# Patient Record
Sex: Male | Born: 1935 | Race: White | Hispanic: No | Marital: Married | Smoking: Former smoker
Health system: Southern US, Community
[De-identification: ages and names within clinical notes are randomized; demographics above are authoritative.]

## PROBLEM LIST (undated history)

## (undated) DIAGNOSIS — I10 Essential (primary) hypertension: Secondary | ICD-10-CM

## (undated) DIAGNOSIS — N400 Enlarged prostate without lower urinary tract symptoms: Secondary | ICD-10-CM

## (undated) DIAGNOSIS — N183 Chronic kidney disease, stage 3 unspecified: Secondary | ICD-10-CM

## (undated) DIAGNOSIS — M4800 Spinal stenosis, site unspecified: Secondary | ICD-10-CM

## (undated) DIAGNOSIS — M1711 Unilateral primary osteoarthritis, right knee: Secondary | ICD-10-CM

## (undated) DIAGNOSIS — Z87442 Personal history of urinary calculi: Secondary | ICD-10-CM

## (undated) DIAGNOSIS — C801 Malignant (primary) neoplasm, unspecified: Secondary | ICD-10-CM

## (undated) HISTORY — PX: HIP ARTHROPLASTY: SHX981

## (undated) HISTORY — PX: TONSILLECTOMY: SUR1361

## (undated) HISTORY — PX: OTHER SURGICAL HISTORY: SHX169

## (undated) HISTORY — PX: JOINT REPLACEMENT: SHX530

## (undated) HISTORY — PX: EYE SURGERY: SHX253

---

## 2009-01-13 ENCOUNTER — Inpatient Hospital Stay (HOSPITAL_COMMUNITY): Admission: RE | Admit: 2009-01-13 | Discharge: 2009-01-17 | Payer: Self-pay | Admitting: Orthopedic Surgery

## 2009-03-21 ENCOUNTER — Encounter: Admission: RE | Admit: 2009-03-21 | Discharge: 2009-03-21 | Payer: Self-pay | Admitting: Orthopedic Surgery

## 2009-09-08 ENCOUNTER — Inpatient Hospital Stay (HOSPITAL_COMMUNITY): Admission: RE | Admit: 2009-09-08 | Discharge: 2009-09-09 | Payer: Self-pay | Admitting: Neurosurgery

## 2009-10-09 ENCOUNTER — Encounter: Admission: RE | Admit: 2009-10-09 | Discharge: 2009-10-09 | Payer: Self-pay | Admitting: Neurosurgery

## 2011-03-19 LAB — TYPE AND SCREEN
ABO/RH(D): A POS
Antibody Screen: NEGATIVE

## 2011-03-19 LAB — DIFFERENTIAL
Eosinophils Relative: 5 % (ref 0–5)
Lymphocytes Relative: 23 % (ref 12–46)
Lymphs Abs: 1.2 10*3/uL (ref 0.7–4.0)
Monocytes Relative: 14 % — ABNORMAL HIGH (ref 3–12)
Neutrophils Relative %: 57 % (ref 43–77)

## 2011-03-19 LAB — BASIC METABOLIC PANEL
BUN: 14 mg/dL (ref 6–23)
CO2: 26 mEq/L (ref 19–32)
Chloride: 105 mEq/L (ref 96–112)
GFR calc Af Amer: >60 mL/min (ref >60)
Glucose, Bld: 97 mg/dL (ref 70–99)
Potassium: 4.1 mEq/L (ref 3.5–5.1)
Sodium: 136 mEq/L (ref 135–145)

## 2011-03-19 LAB — CBC
HCT: 40.4 % (ref 39.0–52.0)
Hemoglobin: 13.9 g/dL (ref 13.0–17.0)
RBC: 4.1 MIL/uL — ABNORMAL LOW (ref 4.22–5.81)
WBC: 5 10*3/uL (ref 4.0–10.5)

## 2011-03-29 LAB — URINALYSIS, ROUTINE W REFLEX MICROSCOPIC
Bilirubin Urine: NEGATIVE
Glucose, UA: NEGATIVE mg/dL
Nitrite: NEGATIVE
Specific Gravity, Urine: 1.017 (ref 1.005–1.030)
pH: 6 (ref 5.0–8.0)

## 2011-03-29 LAB — DIFFERENTIAL
Basophils Relative: 2 % — ABNORMAL HIGH (ref 0–1)
Lymphocytes Relative: 27 % (ref 12–46)
Lymphs Abs: 1.8 10*3/uL (ref 0.7–4.0)
Neutro Abs: 3.9 10*3/uL (ref 1.7–7.7)
Neutrophils Relative %: 58 % (ref 43–77)

## 2011-03-29 LAB — CBC
Hemoglobin: 14.3 g/dL (ref 13.0–17.0)
MCV: 99.1 fL (ref 78.0–100.0)
Platelets: 181 10*3/uL (ref 150–400)

## 2011-03-29 LAB — APTT: aPTT: 24 seconds (ref 24–37)

## 2011-03-29 LAB — BASIC METABOLIC PANEL
BUN: 17 mg/dL (ref 6–23)
Chloride: 103 mEq/L (ref 96–112)
Potassium: 4.3 mEq/L (ref 3.5–5.1)
Sodium: 138 mEq/L (ref 135–145)

## 2011-03-29 LAB — PROTIME-INR
INR: 1 (ref 0.00–1.49)
Prothrombin Time: 12.9 seconds (ref 11.6–15.2)

## 2011-03-29 LAB — TYPE AND SCREEN

## 2011-03-30 LAB — CROSSMATCH: ABO/RH(D): A POS

## 2011-03-30 LAB — BASIC METABOLIC PANEL
Calcium: 7.8 mg/dL — ABNORMAL LOW (ref 8.4–10.5)
Chloride: 102 mEq/L (ref 96–112)
GFR calc Af Amer: 56 mL/min — ABNORMAL LOW (ref >60)
GFR calc non Af Amer: 46 mL/min — ABNORMAL LOW (ref >60)
Sodium: 135 mEq/L (ref 135–145)

## 2011-03-30 LAB — CBC
HCT: 24.9 % — ABNORMAL LOW (ref 39.0–52.0)
Hemoglobin: 10 g/dL — ABNORMAL LOW (ref 13.0–17.0)
Hemoglobin: 8.8 g/dL — ABNORMAL LOW (ref 13.0–17.0)
Hemoglobin: 9.2 g/dL — ABNORMAL LOW (ref 13.0–17.0)
MCV: 100.2 fL — ABNORMAL HIGH (ref 78.0–100.0)
MCV: 97.7 fL (ref 78.0–100.0)
MCV: 98.6 fL (ref 78.0–100.0)
Platelets: 135 10*3/uL — ABNORMAL LOW (ref 150–400)
Platelets: 136 10*3/uL — ABNORMAL LOW (ref 150–400)
RBC: 2.77 MIL/uL — ABNORMAL LOW (ref 4.22–5.81)
RDW: 12.8 % (ref 11.5–15.5)
RDW: 13 % (ref 11.5–15.5)
WBC: 8.5 10*3/uL (ref 4.0–10.5)
WBC: 9.4 10*3/uL (ref 4.0–10.5)

## 2011-03-30 LAB — PROTIME-INR
INR: 1.2 (ref 0.00–1.49)
INR: 2.3 — ABNORMAL HIGH (ref 0.00–1.49)
Prothrombin Time: 15.6 seconds — ABNORMAL HIGH (ref 11.6–15.2)
Prothrombin Time: 22.3 seconds — ABNORMAL HIGH (ref 11.6–15.2)

## 2011-04-27 NOTE — Op Note (Signed)
NAME:  Eric [name], Eric [name] NO.:  [id number]   MEDICAL RECORD NO.:  [id number]          PATIENT TYPE:  INP   LOCATION:  5032                         FACILITY:  [facility]   PHYSICIAN:  Feliberto Gottron. Turner Daniels, M.D.   DATE OF BIRTH:  1929/09/24   DATE OF PROCEDURE:  01/13/2009  DATE OF DISCHARGE:                               OPERATIVE REPORT   PREOPERATIVE DIAGNOSIS:  End-stage arthritis left hip.   POSTOPERATIVE DIAGNOSIS:  End-stage arthritis left hip.   PROCEDURE:  Left total hip arthroplasty using a DePuy 56-mm ASR cup, S-  ROM 18 x 13 x 42 x 150 stem, 18 F large cone NK +0 49-mm ultimate head.   SURGEON:  Feliberto Gottron.  Turner Daniels, MD   FIRST ASSISTANT:  Shirl Harris, PA-C   ANESTHETIC:  General endotracheal.   ESTIMATED BLOOD LOSS:  400 mL   FLUID REPLACEMENT:  1800 mL of crystalloid.   DRAINS PLACED:  Foley catheter.   URINE OUTPUT:  300 mL.   INDICATIONS FOR PROCEDURE:  A 77 year old male with end-stage arthritis  of his left hip who has failed conservative treatment, has bone-on-bone  arthritic changes on x-ray and the pain is affecting his ability to do  household chores, to ambulate and is waking him up at night.  He desires  elective left total hip arthroplasty.  Risks and benefits of surgery  have been discussed and questions answered.   DESCRIPTION OF PROCEDURE:  The patient identified by armband and  received preoperative antibiotics IV in the holding area at [facility].  He was then taken to the operating room 1 where the  appropriate anesthetic monitors were attached and general endotracheal  anesthesia induced with the patient in supine position.  A Foley  catheter was inserted.  He was rolled into the right lateral decubitus  position and fixed there with a Stulberg Mark II pelvic clamp.  The left  lower extremity was then prepped and draped in usual sterile fashion  from the ankle to the hemipelvis.  Standard time-out procedure was  performed.  The  skin along the lateral hip and thigh was infiltrated  with 20 mL of 0.5%Marcaine and epinephrine solution and a 20-cm incision  centered over the greater trochanter was made through the skin and  subcutaneous tissue.  Small bleeders were identified and cauterized with  electrocautery.  We cut down to the IT band and made an incision in the  IT band paralleling the skin incision.  This took [place] down to the greater  trochanter and a Cobra retractor was placed between the gluteus minimus  and the superior hip joint capsule and between the quadratus femoris and  the inferior hip joint capsule.  This isolated the piriformis and short  external rotators which were then tagged with a #2 Ethibond suture and  cut off their insertion on the intertrochanteric crest exposing the hip  joint capsule itself.  This was developed into an acetabular based flap  going from posterior-superior and the acetabulum out of the femoral neck  and then out inferior posterior on the acetabulum.  This flap was tagged  with two #2 Ethibond sutures exposing the arthritic femoral head.  The  hip was flexed, internally rotated dislocating the femoral head and a  standard neck cut was performed one fingerbreadth above the lesser  trochanter.  The proximal femur was then translated anteriorly using a  Hohmann retractor levering over the anterior column.  A posterior  inferior wing retractor was placed at the junction of the ischium and  the acetabulum and a Cobra spiked retractor was placed in the cotyloid  notch.  We then excised the labrum and sequentially reamed up to a 55-mm  basket reamer obtaining good fit and fill in all quadrants, touched the  edge only with a 56 reamer, irrigated the acetabulum out with normal  saline solution and inserted a 56-mm ASR cup in 45 degrees of abduction  and about 20 degrees of anteversion following the normal rim of the  acetabulum.  Peripheral osteophytes were then removed.  The cuff  had  good fit and fill and you can almost looked as pelvis off the table with  the inserter.  This was then flexed and internally rotated exposing the  proximal femur which was then entered with a box cutting chisel followed  by the initiating reamer and then axial reaming up to a 12-mm axial  reamer where we first started getting chatter.  We then went by halves  to 13.5 in an excellent cortical chatter reamed to the proximal 4 inches  of the femur with the 14-mm reamer and then conically reamed up to an 18  F cone and milled the calcar to an 18 F large calcar piece.  We then  inserted an 86 F large trial cone, followed by a trial stem with a 42  base neck and a NK +0 49-mm trial head in the same version as the calcar  about 15 degrees.  The hip was reduced and then taken through a range of  motion to 90 of flexion with 60 or 70 of internal rotation before any  instability and it could not be dislocated in extension and external  rotation.  At this point, the trial components were removed and an 54 F  large ZTT1 cone was hammered into place followed by an 18 x 13 x 42 x  150 stem in the same version as the cone.  Once the stem was seated, an  NK +0 49-mm ultimate head was placed on the stem and the hip reduced and  taken through a range of motion and stability was excellent.  Small  bleeders were once again identified and cauterized.  The wound irrigated  out with normal saline solution.  The capsular flap and short external  rotators were repaired back to the intertrochanteric crest through drill  holes using #2 Ethibond suture and a small crack along the tip of the  greater trochanter was repaired back to the greater trochanter through  drill holes using a #2 FiberWire figure-of-eight suture.  This was only  the posterior quarter inch of the abductor insertion.  80-90% of the  insertion was completely intact.  At this point, we irrigated out one  more time with normal saline solution.   The IT band was then closed with  running #1 Vicryl suture, the subcutaneous tissue with 0 in 2 layers of  2-0 Vicryl suture and the skin with running interlocking 3-0 nylon  suture.  A dressing of Xeroform and Mepilex was then applied.  The  patient was unclamped, rolled supine, awakened  and taken to the recovery  room without difficulty.      Feliberto Gottron. Turner Daniels, M.D.  Electronically Signed     FJR/MEDQ  D:  01/13/2009  T:  01/14/2009  Job:  [id number]

## 2011-04-30 NOTE — Discharge Summary (Signed)
NAME:  Eric [name], Eric [name] NO.:  [id number]   MEDICAL RECORD NO.:  [id number]          PATIENT TYPE:  INP   LOCATION:  5032                         FACILITY:  [facility]   PHYSICIAN:  Feliberto Gottron. Turner Daniels, M.D.   DATE OF BIRTH:  1929-12-12   DATE OF ADMISSION:  01/13/2009  DATE OF DISCHARGE:  01/17/2009                               DISCHARGE SUMMARY   CHIEF COMPLAINT:  Left hip pain.   HISTORY OF PRESENT ILLNESS:  This is a 77 year old gentleman who  complains of severe unremitting pain in his left hip, tried conservative  treatment with antiinflammatories and narcotic pain medicines.  He  desires a surgical intervention at this time.  All risks and benefits of  surgery were discussed with the patient.   PAST MEDICAL HISTORY:  Significant for hypertension.   PAST SURGICAL HISTORY:  He has had a carpal tunnel release and elbow  surgery.   SOCIAL HISTORY:  He denies the use of tobacco, alcohol.   He has no known drug allergies.   CURRENT MEDICATIONS:  Lisinopril 20 mg 1 p.o. daily.   FAMILY HISTORY:  Noncontributory.   PHYSICAL EXAMINATION:  Gross examination of the left hip demonstrates  the patient to have tenderness with internal rotation which is minimal.  He has approximately 20 degrees external rotation.  He is  neurovascularly intact.  X-rays demonstrate bone-on-bone degenerative  joint disease of the left hip.   PREOPERATIVE LABORATORY DATA:  White blood cells 6.7, red blood cells  4.3, hemoglobin 14.3, hematocrit 42.6, platelets 181.  PT 12.9, INR of  1.0, PTT 24.  Sodium 138, potassium 4.3, chloride 103, glucose 113, BUN  17, creatinine 1.14.  Urinalysis was within normal limits.   HOSPITAL COURSE:  Mr. Abreu was admitted to [facility] on January 13, 2009, when he underwent a left total hip arthroplasty, performed by  Dr. Gean Birchwood.  A perioperative Foley catheter was placed.  The  patient tolerated the procedure well and was transferred to the  orthopedic floor.  He was placed on Lovenox and Coumadin for DVT  prophylaxis.  On the first postoperative day, the patient was awake and  alert.  Hemoglobin was 10.0.  The urine output was minimal, so he was  given a bolus of normal saline.  He was evaluated by physical therapy.  On the second postoperative day, the patient was awake and alert and  voiding well.  He had ambulated 20 feet with physical therapy on the  previous day.  Hemoglobin was 9.2. His Foley catheter was discontinued.  On the third postoperative day, the patient complained of fatigue and  dizziness with physical therapy.  His hemoglobin was 8.8, and he was  transfused with a 1 unit of packed red blood cells.  His physical  therapy was somewhat limited by pain in his knee secondary to known  arthritis, so he was given a compressive knee sleeve to allow him to do  therapy.  On the fourth postoperative day, the patient's hemoglobin had  increased to 9.1.  His surgical dressing was clean and dry, and his  incision remained benign.  He was able to pass all of his physical  therapies on this day and was discharged.  The home health care managed  his wound and  Coumadin.   DISPOSITION:  The patient was discharged home on January 17, 2009.  He  was weightbearing as tolerated.  Home health care will manage his wound,  Coumadin, and physical therapy and he would return to the clinic in 1  week to see Dr. Turner Daniels.   Discharge medicines were as per the HMR with the addition of Percocet 5  mg 1-2 tablets p.o. q.4 h. p.r.n. pain and Coumadin dosed by the  pharmacy to take as directed with a target INR of 1.5-2.   FINAL DIAGNOSIS:  End-stage degenerative joint disease of the left hip  with a secondary diagnosis of acute blood loss anemia.      Shirl Harris, PA      Feliberto Gottron. Turner Daniels, M.D.  Electronically Signed    JW/MEDQ  D:  02/17/2009  T:  02/17/2009  Job:  [id number]

## 2011-06-03 ENCOUNTER — Other Ambulatory Visit: Payer: Self-pay | Admitting: Orthopedic Surgery

## 2011-06-03 DIAGNOSIS — M25552 Pain in left hip: Secondary | ICD-10-CM

## 2011-06-05 ENCOUNTER — Ambulatory Visit
Admission: RE | Admit: 2011-06-05 | Discharge: 2011-06-05 | Disposition: A | Discharge status: Home / Self Care | Payer: Medicare Other | Source: Ambulatory Visit | Attending: Orthopedic Surgery | Admitting: Orthopedic Surgery

## 2011-06-05 DIAGNOSIS — M25552 Pain in left hip: Secondary | ICD-10-CM

## 2014-02-06 ENCOUNTER — Other Ambulatory Visit (HOSPITAL_COMMUNITY): Payer: Self-pay | Admitting: Orthopedic Surgery

## 2014-02-06 DIAGNOSIS — M25552 Pain in left hip: Secondary | ICD-10-CM

## 2014-02-21 ENCOUNTER — Ambulatory Visit (HOSPITAL_COMMUNITY)
Admission: RE | Admit: 2014-02-21 | Discharge: 2014-02-21 | Disposition: A | Discharge status: Home / Self Care | Payer: Medicare Other | Source: Ambulatory Visit | Attending: Orthopedic Surgery | Admitting: Orthopedic Surgery

## 2014-02-21 DIAGNOSIS — M25552 Pain in left hip: Secondary | ICD-10-CM

## 2014-02-21 DIAGNOSIS — M25559 Pain in unspecified hip: Secondary | ICD-10-CM | POA: Insufficient documentation

## 2015-11-19 DIAGNOSIS — N400 Enlarged prostate without lower urinary tract symptoms: Secondary | ICD-10-CM | POA: Insufficient documentation

## 2015-11-19 DIAGNOSIS — M4800 Spinal stenosis, site unspecified: Secondary | ICD-10-CM | POA: Insufficient documentation

## 2015-11-19 DIAGNOSIS — H35033 Hypertensive retinopathy, bilateral: Secondary | ICD-10-CM | POA: Insufficient documentation

## 2016-09-15 ENCOUNTER — Other Ambulatory Visit: Payer: Self-pay | Admitting: Orthopedic Surgery

## 2016-09-22 ENCOUNTER — Encounter (HOSPITAL_BASED_OUTPATIENT_CLINIC_OR_DEPARTMENT_OTHER): Payer: Self-pay | Admitting: *Deleted

## 2016-09-23 ENCOUNTER — Encounter (HOSPITAL_BASED_OUTPATIENT_CLINIC_OR_DEPARTMENT_OTHER): Payer: Self-pay | Admitting: *Deleted

## 2016-09-23 NOTE — Progress Notes (Signed)
This patient has screened at an elevated risk for Obstructive Sleep Apnea using the STOP-Bang tool during a presurgical visit. A score of 4 or greater is an elevated risk. 

## 2016-09-24 ENCOUNTER — Encounter (HOSPITAL_BASED_OUTPATIENT_CLINIC_OR_DEPARTMENT_OTHER)
Admission: RE | Admit: 2016-09-24 | Discharge: 2016-09-24 | Disposition: A | Discharge status: Home / Self Care | Payer: Medicare Other | Source: Ambulatory Visit | Attending: Orthopedic Surgery | Admitting: Orthopedic Surgery

## 2016-09-24 DIAGNOSIS — G5601 Carpal tunnel syndrome, right upper limb: Secondary | ICD-10-CM | POA: Diagnosis not present

## 2016-09-24 DIAGNOSIS — Z01818 Encounter for other preprocedural examination: Secondary | ICD-10-CM | POA: Insufficient documentation

## 2016-09-28 NOTE — H&P (Signed)
Eric [name] is an 82 y.o. male.    Chief Complaint: Right Hand pain and numbness  HPI: Eric [name] is an 82 year old male that is presenting with right hand pain and numbness.  This pain is been ongoing for the past 1-2 years.  He wears a night splint at times with minimal improvement.  He has had left wrist carpal tunnel release.  He doesn't feel his symptoms are as severe as his left hand was.  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  He is not able to pick some things up.  He denies dropping things that he has been caring.  He has not taken any medications.  Past Medical History:  Diagnosis Date  . BPH (benign prostatic hyperplasia)   . Chronic kidney disease    pt states no problems with kidneys  . Hypertension   . Spinal stenosis     Past Surgical History:  Procedure Laterality Date  . cataract surgery Bilateral ~15 yrs ago  . JOINT REPLACEMENT     hip  . TONSILLECTOMY      History reviewed. No pertinent family history. Social History:  reports that he has quit smoking. His smoking use included Cigarettes. He has never used smokeless tobacco. He reports that he does not drink alcohol or use drugs.  Allergies:  Allergies  Allergen Reactions  . Nsaids     Renal insufficiency     No prescriptions prior to admission.    No results found for this or any previous visit (from the past 48 hour(s)). No results found.  Review of Systems  Constitutional: Negative.   HENT: Positive for tinnitus.   Eyes: Negative.   Respiratory: Negative.   Cardiovascular: Negative.   Gastrointestinal: Negative.   Genitourinary: Positive for frequency.       Enlarged prostate  Musculoskeletal: Negative.   Skin: Negative.   Neurological: Negative.   Endo/Heme/Allergies: Negative.   Psychiatric/Behavioral: Negative.     Height 5\' 9"  (1.753 m), weight 113.4 kg (250 lb). Physical Exam  Constitutional: He is oriented to person, place, and time. He appears well-developed and  well-nourished.  HENT:  Head: Normocephalic and atraumatic.  Eyes: Pupils are equal, round, and reactive to light.  Neck: Normal range of motion. Neck supple.  Cardiovascular: Intact distal pulses.   Respiratory: Effort normal.  Musculoskeletal:  Skin is appropriately warm to touch.  No swelling.  No signs of compromise.  Right wrist: No obvious atrophy.  Normal thumb opposition.  Normal pincer grasp.  Negative grind test.  Positive Tinel's test.  Normal range of motion of the wrist.  Neurological: He is alert and oriented to person, place, and time.  Skin: Skin is warm and dry.  Psychiatric: He has a normal mood and affect. His behavior is normal. Judgment and thought content normal.     Assessment/Plan 1.  Right hand pain and numbness related to carpal tunnel syndrome.  Plan: We will plan to proceed with carpal tunnel release surgery of the right wrist.  Zaara Sprowl R, PA-C 09/28/2016, 8:06 AM

## 2016-09-29 ENCOUNTER — Encounter (HOSPITAL_BASED_OUTPATIENT_CLINIC_OR_DEPARTMENT_OTHER): Payer: Self-pay | Admitting: Certified Registered"

## 2016-09-29 ENCOUNTER — Encounter (HOSPITAL_BASED_OUTPATIENT_CLINIC_OR_DEPARTMENT_OTHER)
Admission: RE | Disposition: A | Discharge status: Home / Self Care | Payer: Self-pay | Source: Ambulatory Visit | Attending: Orthopedic Surgery

## 2016-09-29 ENCOUNTER — Ambulatory Visit (HOSPITAL_BASED_OUTPATIENT_CLINIC_OR_DEPARTMENT_OTHER): Payer: Medicare Other | Admitting: Certified Registered"

## 2016-09-29 ENCOUNTER — Ambulatory Visit (HOSPITAL_BASED_OUTPATIENT_CLINIC_OR_DEPARTMENT_OTHER)
Admission: RE | Admit: 2016-09-29 | Discharge: 2016-09-29 | Disposition: A | Discharge status: Home / Self Care | Payer: Medicare Other | Source: Ambulatory Visit | Attending: Orthopedic Surgery | Admitting: Orthopedic Surgery

## 2016-09-29 DIAGNOSIS — Z87891 Personal history of nicotine dependence: Secondary | ICD-10-CM | POA: Insufficient documentation

## 2016-09-29 DIAGNOSIS — I10 Essential (primary) hypertension: Secondary | ICD-10-CM | POA: Insufficient documentation

## 2016-09-29 DIAGNOSIS — G5601 Carpal tunnel syndrome, right upper limb: Secondary | ICD-10-CM | POA: Insufficient documentation

## 2016-09-29 DIAGNOSIS — N400 Enlarged prostate without lower urinary tract symptoms: Secondary | ICD-10-CM | POA: Diagnosis not present

## 2016-09-29 DIAGNOSIS — Z79899 Other long term (current) drug therapy: Secondary | ICD-10-CM | POA: Insufficient documentation

## 2016-09-29 HISTORY — DX: Spinal stenosis, site unspecified: M48.00

## 2016-09-29 HISTORY — DX: Essential (primary) hypertension: I10

## 2016-09-29 HISTORY — PX: CARPAL TUNNEL RELEASE: SHX101

## 2016-09-29 HISTORY — DX: Benign prostatic hyperplasia without lower urinary tract symptoms: N40.0

## 2016-09-29 SURGERY — CARPAL TUNNEL RELEASE
Anesthesia: Regional | Site: Wrist | Laterality: Right

## 2016-09-29 MED ORDER — FENTANYL CITRATE (PF) 100 MCG/2ML IJ SOLN
50.0000 ug | INTRAMUSCULAR | Status: DC | PRN
  Administered 2016-09-29 (×2): 25 ug via INTRAVENOUS

## 2016-09-29 MED ORDER — LACTATED RINGERS IV SOLN
INTRAVENOUS | Status: DC
  Administered 2016-09-29: 10:00:00 via INTRAVENOUS

## 2016-09-29 MED ORDER — GLYCOPYRROLATE 0.2 MG/ML IJ SOLN: 0.2000 mg | Freq: Once | INTRAMUSCULAR | Status: DC | PRN

## 2016-09-29 MED ORDER — BUPIVACAINE HCL (PF) 0.5 % IJ SOLN
INTRAMUSCULAR | Status: DC | PRN
  Administered 2016-09-29: 7.5 mL

## 2016-09-29 MED ORDER — FENTANYL CITRATE (PF) 100 MCG/2ML IJ SOLN
INTRAMUSCULAR | Status: AC
  Filled 2016-09-29: qty 2

## 2016-09-29 MED ORDER — BUPIVACAINE HCL (PF) 0.5 % IJ SOLN
INTRAMUSCULAR | Status: AC
  Filled 2016-09-29: qty 30

## 2016-09-29 MED ORDER — MIDAZOLAM HCL 2 MG/2ML IJ SOLN: 1.0000 mg | INTRAMUSCULAR | Status: DC | PRN

## 2016-09-29 MED ORDER — LIDOCAINE 2% (20 MG/ML) 5 ML SYRINGE
INTRAMUSCULAR | Status: AC
  Filled 2016-09-29: qty 5

## 2016-09-29 MED ORDER — LIDOCAINE HCL (PF) 1 % IJ SOLN
INTRAMUSCULAR | Status: AC
  Filled 2016-09-29: qty 30

## 2016-09-29 MED ORDER — DEXTROSE-NACL 5-0.45 % IV SOLN: INTRAVENOUS | Status: DC

## 2016-09-29 MED ORDER — LIDOCAINE HCL (PF) 0.5 % IJ SOLN
INTRAMUSCULAR | Status: DC | PRN
  Administered 2016-09-29: 30 mL via INTRAVENOUS

## 2016-09-29 MED ORDER — CEFAZOLIN SODIUM-DEXTROSE 2-4 GM/100ML-% IV SOLN
2.0000 g | INTRAVENOUS | Status: AC
  Administered 2016-09-29: 2 g via INTRAVENOUS

## 2016-09-29 MED ORDER — CHLORHEXIDINE GLUCONATE 4 % EX LIQD: 60.0000 mL | Freq: Once | CUTANEOUS | Status: DC

## 2016-09-29 MED ORDER — HYDROCODONE-ACETAMINOPHEN 5-325 MG PO TABS
1.0000 | ORAL_TABLET | Freq: Four times a day (QID) | ORAL | 0 refills | Status: DC | PRN
Start: 2016-09-29 — End: 2018-04-04

## 2016-09-29 MED ORDER — CEFAZOLIN SODIUM-DEXTROSE 2-4 GM/100ML-% IV SOLN
INTRAVENOUS | Status: AC
  Filled 2016-09-29: qty 100

## 2016-09-29 MED ORDER — PROPOFOL 10 MG/ML IV BOLUS
INTRAVENOUS | Status: DC | PRN
  Administered 2016-09-29: 20 mg via INTRAVENOUS

## 2016-09-29 SURGICAL SUPPLY — 41 items
BLADE SURG 15 STRL LF DISP TIS (BLADE) ×1 IMPLANT
BLADE SURG 15 STRL SS (BLADE) ×2
BNDG ELASTIC 2X5.8 VLCR STR LF (GAUZE/BANDAGES/DRESSINGS) ×3 IMPLANT
BNDG ESMARK 4X9 LF (GAUZE/BANDAGES/DRESSINGS) ×3 IMPLANT
CORDS BIPOLAR (ELECTRODE) ×3 IMPLANT
COVER BACK TABLE 60X90IN (DRAPES) ×3 IMPLANT
COVER MAYO STAND STRL (DRAPES) ×3 IMPLANT
CUFF TOURNIQUET SINGLE 18IN (TOURNIQUET CUFF) ×3 IMPLANT
DRAPE EXTREMITY T 121X128X90 (DRAPE) ×3 IMPLANT
DRAPE SURG 17X23 STRL (DRAPES) ×3 IMPLANT
DRAPE U-SHAPE 47X51 STRL (DRAPES) IMPLANT
DURAPREP 26ML APPLICATOR (WOUND CARE) ×3 IMPLANT
GAUZE XEROFORM 1X8 LF (GAUZE/BANDAGES/DRESSINGS) ×3 IMPLANT
GLOVE BIO SURGEON STRL SZ7.5 (GLOVE) ×3 IMPLANT
GLOVE BIO SURGEON STRL SZ8.5 (GLOVE) ×3 IMPLANT
GLOVE BIOGEL PI IND STRL 7.0 (GLOVE) ×1 IMPLANT
GLOVE BIOGEL PI IND STRL 8 (GLOVE) ×2 IMPLANT
GLOVE BIOGEL PI IND STRL 9 (GLOVE) ×1 IMPLANT
GLOVE BIOGEL PI INDICATOR 7.0 (GLOVE) ×2
GLOVE BIOGEL PI INDICATOR 8 (GLOVE) ×4
GLOVE BIOGEL PI INDICATOR 9 (GLOVE) ×2
GLOVE SURG SS PI 7.5 STRL IVOR (GLOVE) ×3 IMPLANT
GOWN STRL REUS W/ TWL LRG LVL3 (GOWN DISPOSABLE) ×1 IMPLANT
GOWN STRL REUS W/ TWL XL LVL3 (GOWN DISPOSABLE) ×1 IMPLANT
GOWN STRL REUS W/TWL LRG LVL3 (GOWN DISPOSABLE) ×2
GOWN STRL REUS W/TWL XL LVL3 (GOWN DISPOSABLE) ×5 IMPLANT
NEEDLE HYPO 22GX1.5 SAFETY (NEEDLE) ×3 IMPLANT
NS IRRIG 1000ML POUR BTL (IV SOLUTION) ×3 IMPLANT
PACK BASIN DAY SURGERY FS (CUSTOM PROCEDURE TRAY) ×3 IMPLANT
PADDING CAST ABS 4INX4YD NS (CAST SUPPLIES) ×2
PADDING CAST ABS COTTON 4X4 ST (CAST SUPPLIES) ×1 IMPLANT
PADDING UNDERCAST 2 STRL (CAST SUPPLIES) ×2
PADDING UNDERCAST 2X4 STRL (CAST SUPPLIES) ×1 IMPLANT
SLEEVE SCD COMPRESS KNEE MED (MISCELLANEOUS) IMPLANT
SPONGE GAUZE 4X4 12PLY STER LF (GAUZE/BANDAGES/DRESSINGS) ×6 IMPLANT
SUT ETHILON 4 0 PS 2 18 (SUTURE) ×3 IMPLANT
SUT MON AB 4-0 PC3 18 (SUTURE) IMPLANT
SYR BULB 3OZ (MISCELLANEOUS) ×3 IMPLANT
SYR CONTROL 10ML LL (SYRINGE) ×3 IMPLANT
TOWEL OR 17X24 6PK STRL BLUE (TOWEL DISPOSABLE) ×3 IMPLANT
UNDERPAD 30X30 (UNDERPADS AND DIAPERS) ×3 IMPLANT

## 2016-09-29 NOTE — Transfer of Care (Signed)
Immediate Anesthesia Transfer of Care Note  Patient: Eric [name]  Procedure(s) Performed: Procedure(s): CARPAL TUNNEL RELEASE (Right)  Patient Location: PACU  Anesthesia Type:Bier block  Level of Consciousness: awake, alert , oriented and patient cooperative  Airway & Oxygen Therapy: Patient Spontanous Breathing  Post-op Assessment: Report given to RN and Post -op Vital signs reviewed and stable  Post vital signs: Reviewed and stable  Last Vitals:  Vitals:   09/29/16 1009 09/29/16 1201  BP: (!) 170/80   Pulse: 84 90  Resp: 18 (!) 21  Temp: 36.7 C     Last Pain:  Vitals:   09/29/16 1009  TempSrc: Oral  PainSc: 5       Patients Stated Pain Goal: 2 ([id number])  Complications: No apparent anesthesia complications

## 2016-09-29 NOTE — Anesthesia Postprocedure Evaluation (Signed)
Anesthesia Post Note  Patient: Eric [name]  Procedure(s) Performed: Procedure(s) (LRB): CARPAL TUNNEL RELEASE (Right)  Patient location during evaluation: PACU Anesthesia Type: Bier Block and MAC Level of consciousness: awake and alert Pain management: pain level controlled Vital Signs Assessment: post-procedure vital signs reviewed and stable Respiratory status: spontaneous breathing, nonlabored ventilation, respiratory function stable and patient connected to nasal cannula oxygen Cardiovascular status: blood pressure returned to baseline and stable Postop Assessment: no signs of nausea or vomiting Anesthetic complications: no    Last Vitals:  Vitals:   09/29/16 1220 09/29/16 1237  BP:  140/76  Pulse: 92 92  Resp: 18 18  Temp:  36.9 C    Last Pain:  Vitals:   09/29/16 1237  TempSrc: Oral  PainSc: 0-No pain                 Effie Berkshire

## 2016-09-29 NOTE — Interval H&P Note (Signed)
History and Physical Interval Note:  09/29/2016 11:17 AM  Eric [name]  has presented today for surgery, with the diagnosis of RIGHT CARPAL TUNNEL SYNDROME  The various methods of treatment have been discussed with the patient and family. After consideration of risks, benefits and other options for treatment, the patient has consented to  Procedure(s): CARPAL TUNNEL RELEASE (Right) as a surgical intervention .  The patient's history has been reviewed, patient examined, no change in status, stable for surgery.  I have reviewed the patient's chart and labs.  Questions were answered to the patient's satisfaction.     Kerin Salen

## 2016-09-29 NOTE — Anesthesia Procedure Notes (Signed)
Procedure Name: MAC Date/Time: 09/29/2016 11:32 AM Performed by: [name] Flattery Pre-anesthesia Checklist: Patient identified, Emergency Drugs available, Suction available and Patient being monitored Patient Re-evaluated:Patient Re-evaluated prior to inductionOxygen Delivery Method: Simple face mask Preoxygenation: Pre-oxygenation with 100% oxygen Intubation Type: IV induction

## 2016-09-29 NOTE — Discharge Instructions (Signed)

## 2016-09-29 NOTE — Op Note (Signed)
PATIENT ID:      Eric [name]  MRN:     [id number] DOB/AGE:    04/28/1929 / 82 y.o.       OPERATIVE REPORT    DATE OF PROCEDURE:  09/29/2016       PREOPERATIVE DIAGNOSIS:   RIGHT CARPAL TUNNEL SYNDROME      Estimated body mass index is 37.07 kg/m as calculated from the following:   Height as of this encounter: 5\' 9"  (1.753 m).   Weight as of this encounter: 113.9 kg (251 lb).                                                        POSTOPERATIVE DIAGNOSIS:   RIGHT CARPAL TUNNEL SYNDROME                                                                      PROCEDURE:  Procedure(s): CARPAL TUNNEL RELEASE     SURGEON: [id number]    ASSISTANT:   [name] PA-C   (Present and scrubbed throughout the case, critical for assistance with exposure, retraction, and closure.)         ANESTHESIA: GET    TOURNIQUET TIME: [id number]   COMPLICATIONS:  None        SPECIMENS: None   INDICATIONS FOR PROCEDURE: The patient has R CTS . Pain wakes patient up at least 5 of 7 nights per week with numbness to the fingertips. Patient is failed conservative measures with observation anti-inflammatory medicine and night splints. Risks and benefits of surgery have been discussed, questions answered.    A tourniquet was placed on the forearm. The upper extremity was prepped and draped in the usual sterile fashion from the tourniquet to the fingertips. A timeout procedure was performed.The hand and forearm were wrapped with an Esmarch bandage and the tourniquet inflated to 246mm Hg. A longitudinal palmar incision starting at the wrist flexion crease going distally for 4 cm just to the ulnar-sided the thenar crease was made through the skin and subcutaneous tissue. With traction on the skin edges, we then cut down into the carpal tunnel distally, passed a Freer elevator into the carpal tunnel palmarly and cut down on the elevator performing the carpal tunnel release. The fascial incision was extended into the  forearm with the black handled scissors for 2-3cm.We then examined the tendons and the median nerve which had an hourglass appearance but was intact. There were no ganglia or masses in the carpal tunnel. The wound was irrigated out normal saline solution and the skin only closed with running 4-0 nylon suture. A dressing of Xerofoam, 4 x 4 dressing sponges, 2 inch web roll and 2 inch Ace wrap was applied and the Esmarch tourniquet removed for a tourniquet time of 10 minutes. At this point, a sling was applied, the patient was laid supine awakened extubated and taken to the recovery room.

## 2016-09-29 NOTE — Anesthesia Preprocedure Evaluation (Addendum)
Anesthesia Evaluation  Patient identified by MRN, date of birth, ID band Patient awake    Reviewed: Allergy & Precautions, NPO status , Patient's Chart, lab work & pertinent test results  Airway Mallampati: II  TM Distance: >3 FB Neck ROM: Full    Dental  (+) Edentulous Upper, Edentulous Lower   Pulmonary former smoker,    breath sounds clear to auscultation       Cardiovascular hypertension, Pt. on medications  Rhythm:Regular Rate:Normal     Neuro/Psych negative neurological ROS  negative psych ROS   GI/Hepatic negative GI ROS, Neg liver ROS,   Endo/Other  negative endocrine ROS  Renal/GU   negative genitourinary   Musculoskeletal negative musculoskeletal ROS (+)   Abdominal   Peds negative pediatric ROS (+)  Hematology negative hematology ROS (+)   Anesthesia Other Findings   Reproductive/Obstetrics negative OB ROS                            Lab Results  Component Value Date   WBC 5.0 09/02/2009   HGB 13.9 09/02/2009   HCT 40.4 09/02/2009   MCV 98.6 09/02/2009   PLT 162 09/02/2009   Lab Results  Component Value Date   CREATININE 1.24 09/02/2009   BUN 14 09/02/2009   NA 136 09/02/2009   K 4.1 09/02/2009   CL 105 09/02/2009   CO2 26 09/02/2009   Lab Results  Component Value Date   INR 2.3 (H) 01/17/2009   INR 2.3 (H) 01/16/2009   INR 1.8 (H) 01/15/2009   09/2016 EKG: normal sinus rhythm.  Anesthesia Physical Anesthesia Plan  ASA: II  Anesthesia Plan: Bier Block   Post-op Pain Management:    Induction: Intravenous  Airway Management Planned: Natural Airway  Additional Equipment:   Intra-op Plan:   Post-operative Plan:   Informed Consent: I have reviewed the patients History and Physical, chart, labs and discussed the procedure including the risks, benefits and alternatives for the proposed anesthesia with the patient or authorized representative who has  indicated his/her understanding and acceptance.     Plan Discussed with: CRNA  Anesthesia Plan Comments:         Anesthesia Quick Evaluation

## 2016-09-29 NOTE — Anesthesia Preprocedure Evaluation (Signed)
Anesthesia Evaluation    Airway        Dental   Pulmonary former smoker,           Cardiovascular hypertension, Pt. on medications      Neuro/Psych    GI/Hepatic   Endo/Other    Renal/GU Renal InsufficiencyRenal disease     Musculoskeletal   Abdominal   Peds  Hematology   Anesthesia Other Findings   Reproductive/Obstetrics                             Anesthesia Physical Anesthesia Plan Anesthesia Quick Evaluation

## 2016-09-30 ENCOUNTER — Encounter (HOSPITAL_BASED_OUTPATIENT_CLINIC_OR_DEPARTMENT_OTHER): Payer: Self-pay | Admitting: Orthopedic Surgery

## 2017-01-18 DIAGNOSIS — D3132 Benign neoplasm of left choroid: Secondary | ICD-10-CM | POA: Insufficient documentation

## 2018-03-28 ENCOUNTER — Other Ambulatory Visit: Payer: Self-pay | Admitting: Orthopedic Surgery

## 2018-03-31 DIAGNOSIS — N179 Acute kidney failure, unspecified: Secondary | ICD-10-CM | POA: Insufficient documentation

## 2018-03-31 DIAGNOSIS — N183 Chronic kidney disease, stage 3 unspecified: Secondary | ICD-10-CM | POA: Insufficient documentation

## 2018-03-31 DIAGNOSIS — N1831 Chronic kidney disease, stage 3a: Secondary | ICD-10-CM | POA: Insufficient documentation

## 2018-03-31 DIAGNOSIS — N1832 Chronic kidney disease, stage 3b: Secondary | ICD-10-CM | POA: Insufficient documentation

## 2018-03-31 DIAGNOSIS — I129 Hypertensive chronic kidney disease with stage 1 through stage 4 chronic kidney disease, or unspecified chronic kidney disease: Secondary | ICD-10-CM | POA: Insufficient documentation

## 2018-04-06 NOTE — Pre-Procedure Instructions (Signed)
Eric [name]  04/06/2018    Your procedure is scheduled on Wednesday, Apr 19, 2018 at 12:45 PM.   Report to [facility] Entrance "A" Admitting Office at 10:45 AM.   Call this number if you have problems the morning of surgery: [phone]   Questions prior to day of surgery, please call [phone] between 8 & 4 PM.   Remember:  Do not eat food or drink liquids after midnight Tuesday, 04/18/18.   Take these medicines the morning of surgery with A SIP OF WATER: Tylenol - if needed  Do not use Aspirin products or NSAIDS (Ibuprofen, Aleve, etc) 7 days prior to surgery.   Do not wear jewelry.  Do not wear lotions, powders, cologne or deodorant.  Men may shave face and neck.  Do not bring valuables to the hospital.  [facility] is not responsible for any belongings or valuables.  Contacts, dentures or bridgework may not be worn into surgery.  Leave your suitcase in the car.  After surgery it may be brought to your room.  For patients admitted to the hospital, discharge time will be determined by your treatment team.  [facility] - Preparing for Surgery  Before surgery, you can play an important role.  Because skin is not sterile, your skin needs to be as free of germs as possible.  You can reduce the number of germs on you skin by washing with CHG (chlorahexidine gluconate) soap before surgery.  CHG is an antiseptic cleaner which kills germs and bonds with the skin to continue killing germs even after washing.  Please DO NOT use if you have an allergy to CHG or antibacterial soaps.  If your skin becomes reddened/irritated stop using the CHG and inform your nurse when you arrive at Short Stay.  Do not shave (including legs and underarms) for at least 48 hours prior to the first CHG shower.  You may shave your face.  Please follow these instructions carefully:   1.  Shower with CHG Soap the night before surgery and the                    morning of Surgery.  2.  If you  choose to wash your hair, wash your hair first as usual with your       normal shampoo.  3.  After you shampoo, rinse your hair and body thoroughly to remove the shampoo.  4.  Use CHG as you would any other liquid soap.  You can apply chg directly       to the skin and wash gently with scrungie or a clean washcloth.  5.  Apply the CHG Soap to your body ONLY FROM THE NECK DOWN.        Do not use on open wounds or open sores.  Avoid contact with your eyes, ears, mouth and genitals (private parts).  Wash genitals (private parts) with your normal soap.  6.  Wash thoroughly, paying special attention to the area where your surgery        will be performed.  7.  Thoroughly rinse your body with warm water from the neck down.  8.  DO NOT shower/wash with your normal soap after using and rinsing off       the CHG Soap.  9.  Pat yourself dry with a clean towel.            10.  Wear clean pajamas.  11.  Place clean sheets on your bed the night of your first shower and do not        sleep with pets.  Day of Surgery  Shower as above. Do not apply any lotions/deodorants the morning of surgery.  Please wear clean clothes to the hospital.   Please read over the fact sheets that you were given.

## 2018-04-07 ENCOUNTER — Encounter (HOSPITAL_COMMUNITY): Payer: Self-pay

## 2018-04-07 ENCOUNTER — Other Ambulatory Visit: Payer: Self-pay

## 2018-04-07 ENCOUNTER — Ambulatory Visit (HOSPITAL_COMMUNITY)
Admission: RE | Admit: 2018-04-07 | Discharge: 2018-04-07 | Disposition: A | Discharge status: Home / Self Care | Payer: Medicare Other | Source: Ambulatory Visit | Attending: Orthopedic Surgery | Admitting: Orthopedic Surgery

## 2018-04-07 ENCOUNTER — Encounter (HOSPITAL_COMMUNITY)
Admission: RE | Admit: 2018-04-07 | Discharge: 2018-04-07 | Disposition: A | Discharge status: Home / Self Care | Payer: Medicare Other | Source: Ambulatory Visit | Attending: Orthopedic Surgery | Admitting: Orthopedic Surgery

## 2018-04-07 DIAGNOSIS — Z01818 Encounter for other preprocedural examination: Secondary | ICD-10-CM

## 2018-04-07 DIAGNOSIS — Z01812 Encounter for preprocedural laboratory examination: Secondary | ICD-10-CM | POA: Insufficient documentation

## 2018-04-07 DIAGNOSIS — Z01810 Encounter for preprocedural cardiovascular examination: Secondary | ICD-10-CM | POA: Diagnosis present

## 2018-04-07 DIAGNOSIS — R9431 Abnormal electrocardiogram [ECG] [EKG]: Secondary | ICD-10-CM | POA: Diagnosis not present

## 2018-04-07 HISTORY — DX: Unilateral primary osteoarthritis, right knee: M17.11

## 2018-04-07 HISTORY — DX: Personal history of urinary calculi: Z87.442

## 2018-04-07 HISTORY — DX: Chronic kidney disease, stage 3 unspecified: N18.30

## 2018-04-07 HISTORY — DX: Chronic kidney disease, stage 3 (moderate): N18.3

## 2018-04-07 LAB — URINALYSIS, ROUTINE W REFLEX MICROSCOPIC
Bacteria, UA: NONE SEEN
Bilirubin Urine: NEGATIVE
Glucose, UA: NEGATIVE mg/dL
Hgb urine dipstick: NEGATIVE
Ketones, ur: NEGATIVE mg/dL
NITRITE: NEGATIVE
PH: 5 (ref 5.0–8.0)
Protein, ur: NEGATIVE mg/dL
SPECIFIC GRAVITY, URINE: 1.019 (ref 1.005–1.030)
WBC, UA: >50 WBC/hpf — ABNORMAL HIGH (ref 0–5)

## 2018-04-07 LAB — CBC WITH DIFFERENTIAL/PLATELET
Basophils Absolute: 0.1 10*3/uL (ref 0.0–0.1)
Basophils Relative: 1 %
EOS PCT: 9 %
Eosinophils Absolute: 0.5 10*3/uL (ref 0.0–0.7)
HCT: 39.1 % (ref 39.0–52.0)
HEMOGLOBIN: 13.1 g/dL (ref 13.0–17.0)
LYMPHS ABS: 1.1 10*3/uL (ref 0.7–4.0)
LYMPHS PCT: 20 %
MCH: 33.1 pg (ref 26.0–34.0)
MCHC: 33.5 g/dL (ref 30.0–36.0)
MCV: 98.7 fL (ref 78.0–100.0)
MONOS PCT: 11 %
Monocytes Absolute: 0.6 10*3/uL (ref 0.1–1.0)
NEUTROS PCT: 59 %
Neutro Abs: 3.3 10*3/uL (ref 1.7–7.7)
Platelets: 182 10*3/uL (ref 150–400)
RBC: 3.96 MIL/uL — AB (ref 4.22–5.81)
RDW: 13.2 % (ref 11.5–15.5)
WBC: 5.6 10*3/uL (ref 4.0–10.5)

## 2018-04-07 LAB — SURGICAL PCR SCREEN
MRSA, PCR: NEGATIVE
Staphylococcus aureus: NEGATIVE

## 2018-04-07 LAB — TYPE AND SCREEN
ABO/RH(D): A POS
ANTIBODY SCREEN: NEGATIVE

## 2018-04-07 LAB — BASIC METABOLIC PANEL
Anion gap: 12 (ref 5–15)
BUN: 24 mg/dL — AB (ref 6–20)
CHLORIDE: 108 mmol/L (ref 101–111)
CO2: 17 mmol/L — ABNORMAL LOW (ref 22–32)
Calcium: 8.9 mg/dL (ref 8.9–10.3)
Creatinine, Ser: 1.48 mg/dL — ABNORMAL HIGH (ref 0.61–1.24)
GFR calc Af Amer: 48 mL/min — ABNORMAL LOW (ref >60)
GFR, EST NON AFRICAN AMERICAN: 41 mL/min — AB (ref >60)
GLUCOSE: 93 mg/dL (ref 65–99)
POTASSIUM: 4.6 mmol/L (ref 3.5–5.1)
Sodium: 137 mmol/L (ref 135–145)

## 2018-04-07 LAB — PROTIME-INR
INR: 0.98
Prothrombin Time: 12.9 seconds (ref 11.4–15.2)

## 2018-04-07 LAB — APTT: APTT: 26 s (ref 24–36)

## 2018-04-07 NOTE — Progress Notes (Signed)
Wells Guiles from Dr. Damita Dunnings office made aware of the abnormal UA result.

## 2018-04-07 NOTE — Progress Notes (Addendum)
PCP - PA Jinny Blossom [facility]- [facility]  Cardiologist - Denies  Chest x-ray - 04/07/18  EKG - 04/07/18  Stress Test - Denies  ECHO - Denies  Cardiac Cath - Denies  Sleep Study - Denies CPAP - None  LABS- 04/07/18: CBC w/D, BMP, PT, PTT, T/S, UA   Anesthesia- Yes- EKG  Pt denies having chest pain, sob, or fever at this time. All instructions explained to the pt, with a verbal understanding of the material. Pt agrees to go over the instructions while at home for a better understanding. The opportunity to ask questions was provided.

## 2018-04-10 ENCOUNTER — Encounter (HOSPITAL_COMMUNITY): Payer: Self-pay

## 2018-04-14 ENCOUNTER — Other Ambulatory Visit: Payer: Self-pay | Admitting: Orthopedic Surgery

## 2018-04-14 NOTE — Care Plan (Signed)
Spoke with patient and wife prior to surgery. He plans to discharge to home with her and HHPT provided by [facility]. He has all needed equipment at home. He is scheduled to follow up with Dr. Mayer Camel in the office on 05/02/18 @ 1015 and will follow with OPPT at [facility] at 1120. He has all needed equipment at home.   Please contact Ladell Heads, [place] with any questions or if this plan should need to be changed.   Thanks

## 2018-04-18 DIAGNOSIS — M1711 Unilateral primary osteoarthritis, right knee: Secondary | ICD-10-CM | POA: Diagnosis present

## 2018-04-18 MED ORDER — TRANEXAMIC ACID 1000 MG/10ML IV SOLN
2000.0000 mg | INTRAVENOUS | Status: AC
  Administered 2018-04-19: 2000 mg via TOPICAL
  Filled 2018-04-18: qty 20

## 2018-04-18 MED ORDER — TRANEXAMIC ACID 1000 MG/10ML IV SOLN
2000.0000 mg | INTRAVENOUS | Status: DC
  Filled 2018-04-18: qty 20

## 2018-04-18 MED ORDER — TRANEXAMIC ACID 1000 MG/10ML IV SOLN
1000.0000 mg | INTRAVENOUS | Status: AC
  Administered 2018-04-19: 1000 mg via INTRAVENOUS
  Filled 2018-04-18: qty 1100

## 2018-04-18 MED ORDER — BUPIVACAINE LIPOSOME 1.3 % IJ SUSP
20.0000 mL | INTRAMUSCULAR | Status: AC
  Administered 2018-04-19: 20 mL
  Filled 2018-04-18: qty 20

## 2018-04-18 NOTE — H&P (Signed)
TOTAL KNEE ADMISSION H&P  Patient is being admitted for right total knee arthroplasty.  Subjective:  Chief Complaint:right knee pain.  HPI: Eric [name], 84 y.o. male, has a history of pain and functional disability in the right knee due to arthritis and has failed non-surgical conservative treatments for greater than 12 weeks to includeNSAID's and/or analgesics, corticosteriod injections, viscosupplementation injections, flexibility and strengthening excercises, use of assistive devices, weight reduction as appropriate and activity modification.  Onset of symptoms was gradual, starting 4 years ago with gradually worsening course since that time. The patient noted no past surgery on the right knee(s).  Patient currently rates pain in the right knee(s) at 10 out of 10 with activity. Patient has night pain, worsening of pain with activity and weight bearing, pain that interferes with activities of daily living, pain with passive range of motion, crepitus and joint swelling.  Patient has evidence of joint space narrowing by imaging studies.  There is no active infection.  There are no active problems to display for this patient.  Past Medical History:  Diagnosis Date  . BPH (benign prostatic hyperplasia)   . CKD (chronic kidney disease), stage III ([place])   . History of kidney stones   . Hypertension   . Osteoarthritis of right knee   . Spinal stenosis     Past Surgical History:  Procedure Laterality Date  . CARPAL TUNNEL RELEASE Right 09/29/2016   Procedure: CARPAL TUNNEL RELEASE;  Surgeon: Frederik Pear, MD;  Location: [place];  Service: Orthopedics;  Laterality: Right;  . cataract surgery Bilateral ~15 yrs ago  . EYE SURGERY     Bilateral  . HIP ARTHROPLASTY     Left  . JOINT REPLACEMENT     hip  . TONSILLECTOMY      Current Facility-Administered Medications  Medication Dose Route Frequency Provider Last Rate Last Dose  . tranexamic acid (CYKLOKAPRON) 2,000 mg  in sodium chloride 0.9 % 50 mL Topical Application  4,193 mg Topical To OR Frederik Pear, MD       Current Outpatient Medications  Medication Sig Dispense Refill Last Dose  . acetaminophen (TYLENOL 8 HOUR ARTHRITIS PAIN) 650 MG CR tablet Take 650 mg by mouth every 8 (eight) hours as needed for pain.     Marland Kitchen lisinopril (PRINIVIL,ZESTRIL) 40 MG tablet Take 40 mg by mouth daily.      Allergies  Allergen Reactions  . Nsaids     Renal insufficiency     Social History   Tobacco Use  . Smoking status: Former Smoker    Types: Cigarettes  . Smokeless tobacco: Never Used  . Tobacco comment: Quit smoking 50+ years ago  Substance Use Topics  . Alcohol use: No    No family history on file.   Review of Systems  Constitutional: Negative.   HENT: Positive for tinnitus.   Eyes: Negative.   Respiratory: Negative.   Cardiovascular: Negative.   Gastrointestinal: Negative.   Genitourinary: Positive for frequency and urgency.  Musculoskeletal: Positive for joint pain.  Skin: Negative.   Neurological: Negative.   Endo/Heme/Allergies: Negative.   Psychiatric/Behavioral: Negative.     Objective:  Physical Exam  Constitutional: He is oriented to person, place, and time. He appears well-developed and well-nourished.  HENT:  Head: Normocephalic and atraumatic.  Eyes: Pupils are equal, round, and reactive to light.  Neck: Normal range of motion. Neck supple.  Cardiovascular: Intact distal pulses.  Respiratory: Effort normal.  Musculoskeletal:  Bilateral varus deformities of knees  with 10 flexion contractures.  Pain with attempted full extension or flexion past 120.  Collateral ligaments are stable.  Tender along the medial joint line, 1+ effusion on the right no effusion on the left.  Neurovascular intact distally.  Full range of motion of both hips no irritability.  Surgical scar the left side is well-healed, no swelling or erythema.  Patient does report some back pain but thinks it may be  secondary to his altered gait from the arthritic changes.  Neurological: He is alert and oriented to person, place, and time.  Skin: Skin is warm and dry.  Psychiatric: He has a normal mood and affect. His behavior is normal. Judgment and thought content normal.    Vital signs in last 24 hours:    Labs:   Estimated body mass index is 36.57 kg/m as calculated from the following:   Height as of 04/07/18: 5\' 8"  (1.727 m).   Weight as of 04/07/18: 109.1 kg (240 lb 8 oz).   Imaging Review Plain radiographs demonstrate bilateral AP weightbearing, bilateral Rosenberg, lateral and sunrise views of the right knee.  Patient does have end-stage bone-on-bone arthritis of the medial compartment of the right knee.  Left knee has moderate to severe arthritis of the medial compartment.   Preoperative templating of the joint replacement has been completed, documented, and submitted to the Operating Room personnel in order to optimize intra-operative equipment management.   Anticipated LOS equal to or greater than 2 midnights due to - Age 24 and older with one or more of the following:  - Obesity  - Expected need for hospital services (PT, OT, Nursing) required for safe discharge    Assessment/Plan:  End stage arthritis, right knee   The patient history, physical examination, clinical judgment of the provider and imaging studies are consistent with end stage degenerative joint disease of the right knee(s) and total knee arthroplasty is deemed medically necessary. The treatment options including medical management, injection therapy arthroscopy and arthroplasty were discussed at length. The risks and benefits of total knee arthroplasty were presented and reviewed. The risks due to aseptic loosening, infection, stiffness, patella tracking problems, thromboembolic complications and other imponderables were discussed. The patient acknowledged the explanation, agreed to proceed with the plan and consent  was signed. Patient is being admitted for inpatient treatment for surgery, pain control, PT, OT, prophylactic antibiotics, VTE prophylaxis, progressive ambulation and ADL's and discharge planning. The patient is planning to be discharged home with home health services.

## 2018-04-19 ENCOUNTER — Encounter (HOSPITAL_COMMUNITY): Payer: Self-pay | Admitting: Anesthesiology

## 2018-04-19 ENCOUNTER — Encounter (HOSPITAL_COMMUNITY)
Admission: RE | Disposition: A | Discharge status: Home Health | Payer: Self-pay | Source: Ambulatory Visit | Attending: Orthopedic Surgery

## 2018-04-19 ENCOUNTER — Inpatient Hospital Stay (HOSPITAL_COMMUNITY): Payer: Medicare Other | Admitting: Anesthesiology

## 2018-04-19 ENCOUNTER — Inpatient Hospital Stay (HOSPITAL_COMMUNITY)
Admission: RE | Admit: 2018-04-19 | Discharge: 2018-04-22 | DRG: 470 | Disposition: A | Discharge status: Home Health | Payer: Medicare Other | Source: Ambulatory Visit | Attending: Orthopedic Surgery | Admitting: Orthopedic Surgery

## 2018-04-19 ENCOUNTER — Inpatient Hospital Stay (HOSPITAL_COMMUNITY): Payer: Medicare Other | Admitting: Emergency Medicine

## 2018-04-19 DIAGNOSIS — Z87891 Personal history of nicotine dependence: Secondary | ICD-10-CM

## 2018-04-19 DIAGNOSIS — Z882 Allergy status to sulfonamides status: Secondary | ICD-10-CM

## 2018-04-19 DIAGNOSIS — Z6836 Body mass index (BMI) 36.0-36.9, adult: Secondary | ICD-10-CM | POA: Diagnosis not present

## 2018-04-19 DIAGNOSIS — I129 Hypertensive chronic kidney disease with stage 1 through stage 4 chronic kidney disease, or unspecified chronic kidney disease: Secondary | ICD-10-CM | POA: Diagnosis present

## 2018-04-19 DIAGNOSIS — Z886 Allergy status to analgesic agent status: Secondary | ICD-10-CM

## 2018-04-19 DIAGNOSIS — D62 Acute posthemorrhagic anemia: Secondary | ICD-10-CM | POA: Diagnosis not present

## 2018-04-19 DIAGNOSIS — E669 Obesity, unspecified: Secondary | ICD-10-CM | POA: Diagnosis present

## 2018-04-19 DIAGNOSIS — N183 Chronic kidney disease, stage 3 (moderate): Secondary | ICD-10-CM | POA: Diagnosis present

## 2018-04-19 DIAGNOSIS — N400 Enlarged prostate without lower urinary tract symptoms: Secondary | ICD-10-CM | POA: Diagnosis present

## 2018-04-19 DIAGNOSIS — M4800 Spinal stenosis, site unspecified: Secondary | ICD-10-CM | POA: Diagnosis present

## 2018-04-19 DIAGNOSIS — M1711 Unilateral primary osteoarthritis, right knee: Secondary | ICD-10-CM | POA: Diagnosis present

## 2018-04-19 DIAGNOSIS — Z79899 Other long term (current) drug therapy: Secondary | ICD-10-CM

## 2018-04-19 DIAGNOSIS — Z96642 Presence of left artificial hip joint: Secondary | ICD-10-CM | POA: Diagnosis present

## 2018-04-19 HISTORY — PX: TOTAL KNEE ARTHROPLASTY: SHX125

## 2018-04-19 SURGERY — ARTHROPLASTY, KNEE, TOTAL
Anesthesia: Spinal | Site: Knee | Laterality: Right

## 2018-04-19 MED ORDER — HYDROMORPHONE HCL 2 MG/ML IJ SOLN: 0.5000 mg | INTRAMUSCULAR | Status: DC | PRN

## 2018-04-19 MED ORDER — KCL IN DEXTROSE-NACL 20-5-0.45 MEQ/L-%-% IV SOLN
INTRAVENOUS | Status: DC
  Administered 2018-04-19: 19:00:00 via INTRAVENOUS
  Filled 2018-04-19: qty 1000

## 2018-04-19 MED ORDER — FENTANYL CITRATE (PF) 100 MCG/2ML IJ SOLN
25.0000 ug | INTRAMUSCULAR | Status: DC | PRN
  Administered 2018-04-19: 25 ug via INTRAVENOUS

## 2018-04-19 MED ORDER — SODIUM CHLORIDE 0.9 % IJ SOLN
INTRAMUSCULAR | Status: DC | PRN
  Administered 2018-04-19: 50 mL

## 2018-04-19 MED ORDER — PROPOFOL 10 MG/ML IV BOLUS
INTRAVENOUS | Status: AC
  Filled 2018-04-19: qty 20

## 2018-04-19 MED ORDER — PHENOL 1.4 % MT LIQD: 1.0000 | OROMUCOSAL | Status: DC | PRN

## 2018-04-19 MED ORDER — PANTOPRAZOLE SODIUM 40 MG PO TBEC
40.0000 mg | DELAYED_RELEASE_TABLET | Freq: Every day | ORAL | Status: DC
  Administered 2018-04-20 – 2018-04-22 (×3): 40 mg via ORAL
  Filled 2018-04-19 (×3): qty 1

## 2018-04-19 MED ORDER — BUPIVACAINE-EPINEPHRINE (PF) 0.25% -1:200000 IJ SOLN
INTRAMUSCULAR | Status: AC
  Filled 2018-04-19: qty 30

## 2018-04-19 MED ORDER — OXYCODONE-ACETAMINOPHEN 5-325 MG PO TABS: 1.0000 | ORAL_TABLET | ORAL | 0 refills | Status: DC | PRN

## 2018-04-19 MED ORDER — LISINOPRIL 40 MG PO TABS
40.0000 mg | ORAL_TABLET | Freq: Every day | ORAL | Status: DC
  Administered 2018-04-20 – 2018-04-22 (×3): 40 mg via ORAL
  Filled 2018-04-19 (×3): qty 1

## 2018-04-19 MED ORDER — CHLORHEXIDINE GLUCONATE 4 % EX LIQD: 60.0000 mL | Freq: Once | CUTANEOUS | Status: DC

## 2018-04-19 MED ORDER — PROPOFOL 500 MG/50ML IV EMUL
INTRAVENOUS | Status: DC | PRN
  Administered 2018-04-19: 50 ug/kg/min via INTRAVENOUS

## 2018-04-19 MED ORDER — POLYETHYLENE GLYCOL 3350 17 G PO PACK: 17.0000 g | PACK | Freq: Every day | ORAL | Status: DC | PRN

## 2018-04-19 MED ORDER — DIPHENHYDRAMINE HCL 12.5 MG/5ML PO ELIX: 12.5000 mg | ORAL_SOLUTION | ORAL | Status: DC | PRN

## 2018-04-19 MED ORDER — METOCLOPRAMIDE HCL 5 MG/ML IJ SOLN: 5.0000 mg | Freq: Three times a day (TID) | INTRAMUSCULAR | Status: DC | PRN

## 2018-04-19 MED ORDER — ONDANSETRON HCL 4 MG PO TABS: 4.0000 mg | ORAL_TABLET | Freq: Four times a day (QID) | ORAL | Status: DC | PRN

## 2018-04-19 MED ORDER — DEXAMETHASONE SODIUM PHOSPHATE 10 MG/ML IJ SOLN
INTRAMUSCULAR | Status: DC | PRN
  Administered 2018-04-19: 10 mg via INTRAVENOUS

## 2018-04-19 MED ORDER — FENTANYL CITRATE (PF) 100 MCG/2ML IJ SOLN
INTRAMUSCULAR | Status: AC
  Filled 2018-04-19: qty 2

## 2018-04-19 MED ORDER — FENTANYL CITRATE (PF) 100 MCG/2ML IJ SOLN
INTRAMUSCULAR | Status: AC
  Administered 2018-04-19: 25 ug via INTRAVENOUS
  Filled 2018-04-19: qty 2

## 2018-04-19 MED ORDER — MIDAZOLAM HCL 2 MG/2ML IJ SOLN
1.0000 mg | Freq: Once | INTRAMUSCULAR | Status: AC
  Administered 2018-04-19: 1 mg via INTRAVENOUS

## 2018-04-19 MED ORDER — ONDANSETRON HCL 4 MG/2ML IJ SOLN
4.0000 mg | Freq: Four times a day (QID) | INTRAMUSCULAR | Status: DC | PRN
Start: 2018-04-19 — End: 2018-04-22

## 2018-04-19 MED ORDER — MIDAZOLAM HCL 2 MG/2ML IJ SOLN
INTRAMUSCULAR | Status: AC
  Administered 2018-04-19: 1 mg via INTRAVENOUS
  Filled 2018-04-19: qty 2

## 2018-04-19 MED ORDER — DEXTROSE 5 % IV SOLN
INTRAVENOUS | Status: DC | PRN
  Administered 2018-04-19: 20 ug/min via INTRAVENOUS

## 2018-04-19 MED ORDER — 0.9 % SODIUM CHLORIDE (POUR BTL) OPTIME
TOPICAL | Status: DC | PRN
  Administered 2018-04-19: 1000 mL

## 2018-04-19 MED ORDER — OXYCODONE HCL 5 MG PO TABS
5.0000 mg | ORAL_TABLET | ORAL | Status: DC | PRN
  Administered 2018-04-19 – 2018-04-22 (×6): 10 mg via ORAL
  Filled 2018-04-19 (×6): qty 2

## 2018-04-19 MED ORDER — TIZANIDINE HCL 2 MG PO TABS: 2.0000 mg | ORAL_TABLET | Freq: Four times a day (QID) | ORAL | 0 refills | Status: DC | PRN

## 2018-04-19 MED ORDER — MENTHOL 3 MG MT LOZG: 1.0000 | LOZENGE | OROMUCOSAL | Status: DC | PRN

## 2018-04-19 MED ORDER — BUPIVACAINE IN DEXTROSE 0.75-8.25 % IT SOLN
INTRATHECAL | Status: DC | PRN
  Administered 2018-04-19: 2 mL via INTRATHECAL

## 2018-04-19 MED ORDER — GABAPENTIN 300 MG PO CAPS
300.0000 mg | ORAL_CAPSULE | Freq: Three times a day (TID) | ORAL | Status: DC
  Administered 2018-04-19 – 2018-04-22 (×9): 300 mg via ORAL
  Filled 2018-04-19 (×8): qty 1

## 2018-04-19 MED ORDER — LIDOCAINE 2% (20 MG/ML) 5 ML SYRINGE
INTRAMUSCULAR | Status: DC | PRN
  Administered 2018-04-19: 40 mg via INTRAVENOUS

## 2018-04-19 MED ORDER — DEXAMETHASONE SODIUM PHOSPHATE 10 MG/ML IJ SOLN
INTRAMUSCULAR | Status: AC
  Filled 2018-04-19: qty 1

## 2018-04-19 MED ORDER — PROPOFOL 10 MG/ML IV BOLUS
INTRAVENOUS | Status: DC | PRN
  Administered 2018-04-19: 20 mg via INTRAVENOUS
  Administered 2018-04-19: 10 mg via INTRAVENOUS

## 2018-04-19 MED ORDER — LACTATED RINGERS IV SOLN: INTRAVENOUS | Status: DC

## 2018-04-19 MED ORDER — METOCLOPRAMIDE HCL 5 MG PO TABS: 5.0000 mg | ORAL_TABLET | Freq: Three times a day (TID) | ORAL | Status: DC | PRN

## 2018-04-19 MED ORDER — PHENYLEPHRINE 40 MCG/ML (10ML) SYRINGE FOR IV PUSH (FOR BLOOD PRESSURE SUPPORT)
PREFILLED_SYRINGE | INTRAVENOUS | Status: DC | PRN
  Administered 2018-04-19 (×2): 80 ug via INTRAVENOUS

## 2018-04-19 MED ORDER — BUPIVACAINE-EPINEPHRINE (PF) 0.25% -1:200000 IJ SOLN
INTRAMUSCULAR | Status: DC | PRN
  Administered 2018-04-19: 50 mL

## 2018-04-19 MED ORDER — FENTANYL CITRATE (PF) 100 MCG/2ML IJ SOLN
25.0000 ug | Freq: Once | INTRAMUSCULAR | Status: AC
  Administered 2018-04-19: 25 ug via INTRAVENOUS

## 2018-04-19 MED ORDER — TRANEXAMIC ACID 1000 MG/10ML IV SOLN
1000.0000 mg | Freq: Once | INTRAVENOUS | Status: DC
  Filled 2018-04-19: qty 10

## 2018-04-19 MED ORDER — BISACODYL 5 MG PO TBEC
5.0000 mg | DELAYED_RELEASE_TABLET | Freq: Every day | ORAL | Status: DC | PRN
  Administered 2018-04-20: 5 mg via ORAL
  Filled 2018-04-19: qty 1

## 2018-04-19 MED ORDER — ACETAMINOPHEN 325 MG PO TABS
325.0000 mg | ORAL_TABLET | Freq: Four times a day (QID) | ORAL | Status: DC | PRN
  Administered 2018-04-20 – 2018-04-21 (×2): 650 mg via ORAL
  Filled 2018-04-19 (×2): qty 2

## 2018-04-19 MED ORDER — DOCUSATE SODIUM 100 MG PO CAPS
100.0000 mg | ORAL_CAPSULE | Freq: Two times a day (BID) | ORAL | Status: DC
  Administered 2018-04-19 – 2018-04-22 (×6): 100 mg via ORAL
  Filled 2018-04-19 (×5): qty 1

## 2018-04-19 MED ORDER — APIXABAN 2.5 MG PO TABS
2.5000 mg | ORAL_TABLET | Freq: Two times a day (BID) | ORAL | Status: DC
  Administered 2018-04-20 – 2018-04-22 (×5): 2.5 mg via ORAL
  Filled 2018-04-19 (×5): qty 1

## 2018-04-19 MED ORDER — APIXABAN 2.5 MG PO TABS: 2.5000 mg | ORAL_TABLET | Freq: Two times a day (BID) | ORAL | 0 refills | Status: DC

## 2018-04-19 MED ORDER — SODIUM CHLORIDE 0.9 % IR SOLN
Status: DC | PRN
  Administered 2018-04-19: 3000 mL

## 2018-04-19 MED ORDER — METHOCARBAMOL 500 MG PO TABS
500.0000 mg | ORAL_TABLET | Freq: Four times a day (QID) | ORAL | Status: DC | PRN
  Administered 2018-04-19 – 2018-04-21 (×3): 500 mg via ORAL
  Filled 2018-04-19 (×3): qty 1

## 2018-04-19 MED ORDER — CEFAZOLIN SODIUM-DEXTROSE 2-4 GM/100ML-% IV SOLN
INTRAVENOUS | Status: AC
  Filled 2018-04-19: qty 100

## 2018-04-19 MED ORDER — METHOCARBAMOL 1000 MG/10ML IJ SOLN
500.0000 mg | Freq: Four times a day (QID) | INTRAVENOUS | Status: DC | PRN
  Filled 2018-04-19: qty 5

## 2018-04-19 MED ORDER — LACTATED RINGERS IV SOLN
INTRAVENOUS | Status: DC
  Administered 2018-04-19 (×2): via INTRAVENOUS

## 2018-04-19 MED ORDER — ONDANSETRON HCL 4 MG/2ML IJ SOLN
INTRAMUSCULAR | Status: DC | PRN
  Administered 2018-04-19: 4 mg via INTRAVENOUS

## 2018-04-19 MED ORDER — ONDANSETRON HCL 4 MG/2ML IJ SOLN
INTRAMUSCULAR | Status: AC
  Filled 2018-04-19: qty 2

## 2018-04-19 MED ORDER — METHOCARBAMOL 500 MG PO TABS: 500.0000 mg | ORAL_TABLET | Freq: Two times a day (BID) | ORAL | 0 refills | Status: DC

## 2018-04-19 MED ORDER — ALUM & MAG HYDROXIDE-SIMETH 200-200-20 MG/5ML PO SUSP: 30.0000 mL | ORAL | Status: DC | PRN

## 2018-04-19 MED ORDER — FLEET ENEMA 7-19 GM/118ML RE ENEM: 1.0000 | ENEMA | Freq: Once | RECTAL | Status: DC | PRN

## 2018-04-19 MED ORDER — DEXMEDETOMIDINE HCL 200 MCG/2ML IV SOLN
INTRAVENOUS | Status: DC | PRN
  Administered 2018-04-19: 4 ug via INTRAVENOUS
  Administered 2018-04-19: 8 ug via INTRAVENOUS

## 2018-04-19 MED ORDER — CEFAZOLIN SODIUM-DEXTROSE 2-4 GM/100ML-% IV SOLN
2.0000 g | INTRAVENOUS | Status: AC
  Administered 2018-04-19: 2 g via INTRAVENOUS

## 2018-04-19 SURGICAL SUPPLY — 58 items
BANDAGE ESMARK 6X9 LF (GAUZE/BANDAGES/DRESSINGS) ×1 IMPLANT
BLADE SAG 18X100X1.27 (BLADE) ×3 IMPLANT
BLADE SAGITTAL 13X1.27X60 (BLADE) IMPLANT
BLADE SAGITTAL 13X1.27X60MM (BLADE)
BLADE SAW SGTL 13X75X1.27 (BLADE) ×3 IMPLANT
BNDG ELASTIC 6X10 VLCR STRL LF (GAUZE/BANDAGES/DRESSINGS) ×3 IMPLANT
BNDG ESMARK 6X9 LF (GAUZE/BANDAGES/DRESSINGS) ×3
BOWL SMART MIX CTS (DISPOSABLE) ×3 IMPLANT
CAPT KNEE TOTAL 3 ATTUNE ×3 IMPLANT
CEMENT HV SMART SET (Cement) ×6 IMPLANT
COVER SURGICAL LIGHT HANDLE (MISCELLANEOUS) ×3 IMPLANT
CUFF TOURNIQUET SINGLE 34IN LL (TOURNIQUET CUFF) ×3 IMPLANT
DRAPE EXTREMITY T 121X128X90 (DRAPE) ×3 IMPLANT
DRAPE U-SHAPE 47X51 STRL (DRAPES) ×3 IMPLANT
DRSG AQUACEL AG ADV 3.5X10 (GAUZE/BANDAGES/DRESSINGS) ×3 IMPLANT
DURAPREP 26ML APPLICATOR (WOUND CARE) ×6 IMPLANT
ELECT REM PT RETURN 9FT ADLT (ELECTROSURGICAL) ×3
ELECTRODE REM PT RTRN 9FT ADLT (ELECTROSURGICAL) ×1 IMPLANT
GLOVE BIO SURGEON STRL SZ7.5 (GLOVE) ×3 IMPLANT
GLOVE BIO SURGEON STRL SZ8 (GLOVE) ×3 IMPLANT
GLOVE BIO SURGEON STRL SZ8.5 (GLOVE) ×3 IMPLANT
GLOVE BIOGEL M 8.0 STRL (GLOVE) ×3 IMPLANT
GLOVE BIOGEL PI IND STRL 6.5 (GLOVE) ×2 IMPLANT
GLOVE BIOGEL PI IND STRL 7.5 (GLOVE) ×1 IMPLANT
GLOVE BIOGEL PI IND STRL 8 (GLOVE) ×1 IMPLANT
GLOVE BIOGEL PI IND STRL 9 (GLOVE) ×1 IMPLANT
GLOVE BIOGEL PI INDICATOR 6.5 (GLOVE) ×4
GLOVE BIOGEL PI INDICATOR 7.5 (GLOVE) ×2
GLOVE BIOGEL PI INDICATOR 8 (GLOVE) ×2
GLOVE BIOGEL PI INDICATOR 9 (GLOVE) ×2
GLOVE SURG SS PI 7.0 STRL IVOR (GLOVE) ×3 IMPLANT
GOWN STRL REUS W/ TWL LRG LVL3 (GOWN DISPOSABLE) ×1 IMPLANT
GOWN STRL REUS W/ TWL XL LVL3 (GOWN DISPOSABLE) ×3 IMPLANT
GOWN STRL REUS W/TWL LRG LVL3 (GOWN DISPOSABLE) ×2
GOWN STRL REUS W/TWL XL LVL3 (GOWN DISPOSABLE) ×6
HANDPIECE INTERPULSE COAX TIP (DISPOSABLE) ×2
HOOD PEEL AWAY FACE SHEILD DIS (HOOD) ×6 IMPLANT
KIT BASIN OR (CUSTOM PROCEDURE TRAY) ×3 IMPLANT
KIT TURNOVER KIT B (KITS) ×3 IMPLANT
MANIFOLD NEPTUNE II (INSTRUMENTS) ×3 IMPLANT
NEEDLE 22X1 1/2 (OR ONLY) (NEEDLE) ×6 IMPLANT
NS IRRIG 1000ML POUR BTL (IV SOLUTION) ×3 IMPLANT
PACK TOTAL JOINT (CUSTOM PROCEDURE TRAY) ×3 IMPLANT
PAD ARMBOARD 7.5X6 YLW CONV (MISCELLANEOUS) ×6 IMPLANT
SET HNDPC FAN SPRY TIP SCT (DISPOSABLE) ×1 IMPLANT
SUT VIC AB 0 CT1 27 (SUTURE) ×2
SUT VIC AB 0 CT1 27XBRD ANBCTR (SUTURE) ×1 IMPLANT
SUT VIC AB 1 CTX 36 (SUTURE) ×2
SUT VIC AB 1 CTX36XBRD ANBCTR (SUTURE) ×1 IMPLANT
SUT VIC AB 2-0 CT1 27 (SUTURE) ×2
SUT VIC AB 2-0 CT1 TAPERPNT 27 (SUTURE) ×1 IMPLANT
SUT VIC AB 3-0 CT1 27 (SUTURE) ×2
SUT VIC AB 3-0 CT1 TAPERPNT 27 (SUTURE) ×1 IMPLANT
SYR CONTROL 10ML LL (SYRINGE) ×6 IMPLANT
TOWEL OR 17X24 6PK STRL BLUE (TOWEL DISPOSABLE) ×3 IMPLANT
TOWEL OR 17X26 10 PK STRL BLUE (TOWEL DISPOSABLE) ×3 IMPLANT
TRAY CATH 16FR W/PLASTIC CATH (SET/KITS/TRAYS/PACK) ×3 IMPLANT
YANKAUER SUCT BULB TIP NO VENT (SUCTIONS) ×3 IMPLANT

## 2018-04-19 NOTE — Anesthesia Procedure Notes (Signed)
Anesthesia Regional Block: Adductor canal block   Pre-Anesthetic Checklist: ,, timeout performed, Correct Patient, Correct Site, Correct Laterality, Correct Procedure, Correct Position, site marked, Risks and benefits discussed,  Surgical consent,  Pre-op evaluation,  At surgeon's request and post-op pain management  Laterality: Right  Prep: chloraprep       Needles:  Injection technique: Single-shot  Needle Type: Echogenic Needle     Needle Length: 9cm  Needle Gauge: 21     Additional Needles:   Procedures:,,,, ultrasound used (permanent image in chart),,,,  Narrative:  Start time: 04/19/2018 11:40 AM End time: 04/19/2018 11:50 AM Injection made incrementally with aspirations every 5 mL.  Performed by: Personally  Anesthesiologist: Effie Berkshire, MD  Additional Notes: Patient tolerated the procedure well. Local anesthetic introduced in an incremental fashion under minimal resistance after negative aspirations. No paresthesias were elicited. After completion of the procedure, no acute issues were identified and patient continued to be monitored by RN.

## 2018-04-19 NOTE — Anesthesia Procedure Notes (Signed)
Spinal  Patient location during procedure: OR Start time: 04/19/2018 12:28 PM End time: 04/19/2018 12:31 PM Staffing Anesthesiologist: Effie Berkshire, MD Performed: anesthesiologist  Preanesthetic Checklist Completed: patient identified, site marked, surgical consent, pre-op evaluation, timeout performed, IV checked, risks and benefits discussed and monitors and equipment checked Spinal Block Patient position: sitting Prep: DuraPrep Patient monitoring: heart rate, continuous pulse ox, blood pressure and cardiac monitor Approach: midline Location: L3-4 (Just above back incision) Injection technique: single-shot Needle Needle type: Introducer and Pencan  Needle gauge: 24 G Needle length: 9 cm Additional Notes Negative paresthesia. Negative blood return. Positive free-flowing CSF. Expiration date of kit checked and confirmed. Patient tolerated procedure well, without complications.

## 2018-04-19 NOTE — Anesthesia Postprocedure Evaluation (Signed)
Anesthesia Post Note  Patient: Eric [name]  Procedure(s) Performed: TOTAL KNEE ARTHROPLASTY (Right Knee)     Patient location during evaluation: PACU Anesthesia Type: Spinal Level of consciousness: oriented and awake and alert Pain management: pain level controlled Vital Signs Assessment: post-procedure vital signs reviewed and stable Respiratory status: spontaneous breathing, respiratory function stable and patient connected to nasal cannula oxygen Cardiovascular status: blood pressure returned to baseline and stable Postop Assessment: no headache, no backache, no apparent nausea or vomiting, spinal receding and patient able to bend at knees Anesthetic complications: no    Last Vitals:  Vitals:   04/19/18 1651 04/19/18 1707  BP: 133/77 127/69  Pulse: 82 73  Resp: 11 14  Temp:    SpO2: 98% 100%    Last Pain:  Vitals:   04/19/18 1551  TempSrc:   PainSc: 0-No pain                 Effie Berkshire

## 2018-04-19 NOTE — Interval H&P Note (Signed)
History and Physical Interval Note:  04/19/2018 11:54 AM  Eric [name]  has presented today for surgery, with the diagnosis of RIGHT KNEE OSTEOARTHRITIS  The various methods of treatment have been discussed with the patient and family. After consideration of risks, benefits and other options for treatment, the patient has consented to  Procedure(s): TOTAL KNEE ARTHROPLASTY (Right) as a surgical intervention .  The patient's history has been reviewed, patient examined, no change in status, stable for surgery.  I have reviewed the patient's chart and labs.  Questions were answered to the patient's satisfaction.     Kerin Salen

## 2018-04-19 NOTE — Anesthesia Preprocedure Evaluation (Addendum)
Anesthesia Evaluation  Patient identified by MRN, date of birth, ID band Patient awake    Reviewed: Allergy & Precautions, NPO status , Patient's Chart, lab work & pertinent test results  Airway Mallampati: I  TM Distance: >3 FB Neck ROM: Full    Dental  (+) Edentulous Upper, Dental Advisory Given   Pulmonary former smoker,    breath sounds clear to auscultation       Cardiovascular hypertension, Pt. on medications  Rhythm:Regular Rate:Normal     Neuro/Psych negative neurological ROS  negative psych ROS   GI/Hepatic negative GI ROS, Neg liver ROS,   Endo/Other  negative endocrine ROS  Renal/GU Renal disease     Musculoskeletal  (+) Arthritis ,   Abdominal (+) + obese,   Peds  Hematology   Anesthesia Other Findings - Spinal Stenosis  Reproductive/Obstetrics                            Lab Results  Component Value Date   WBC 5.6 04/07/2018   HGB 13.1 04/07/2018   HCT 39.1 04/07/2018   MCV 98.7 04/07/2018   PLT 182 04/07/2018   Lab Results  Component Value Date   CREATININE 1.48 (H) 04/07/2018   BUN 24 (H) 04/07/2018   NA 137 04/07/2018   K 4.6 04/07/2018   CL 108 04/07/2018   CO2 17 (L) 04/07/2018   Lab Results  Component Value Date   INR 0.98 04/07/2018   INR 2.3 (H) 01/17/2009   INR 2.3 (H) 01/16/2009   EKG: normal sinus rhythm.  Anesthesia Physical Anesthesia Plan  ASA: III  Anesthesia Plan: Spinal   Post-op Pain Management:    Induction: Intravenous  PONV Risk Score and Plan:   Airway Management Planned: Simple Face Mask  Additional Equipment: None  Intra-op Plan:   Post-operative Plan:   Informed Consent: I have reviewed the patients History and Physical, chart, labs and discussed the procedure including the risks, benefits and alternatives for the proposed anesthesia with the patient or authorized representative who has indicated his/her understanding  and acceptance.   Dental advisory given  Plan Discussed with: CRNA  Anesthesia Plan Comments: (Previous fusion, will attempt spinal. Consented for GA as well. )       Anesthesia Quick Evaluation

## 2018-04-19 NOTE — Discharge Instructions (Addendum)

## 2018-04-19 NOTE — Transfer of Care (Signed)
Immediate Anesthesia Transfer of Care Note  Patient: Eric [name]  Procedure(s) Performed: TOTAL KNEE ARTHROPLASTY (Right Knee)  Patient Location: PACU  Anesthesia Type:Spinal and MAC combined with regional for post-op pain  Level of Consciousness: awake, alert , oriented and patient cooperative  Airway & Oxygen Therapy: Patient Spontanous Breathing and Patient connected to nasal cannula oxygen  Post-op Assessment: Report given to RN and Post -op Vital signs reviewed and stable  Post vital signs: Reviewed and stable  Last Vitals:  Vitals Value Taken Time  BP 157/107 04/19/2018  2:51 PM  Temp    Pulse 78 04/19/2018  2:54 PM  Resp 10 04/19/2018  2:54 PM  SpO2 100 % 04/19/2018  2:54 PM  Vitals shown include unvalidated device data.  Last Pain:  Vitals:   04/19/18 1059  TempSrc:   PainSc: 0-No pain      Patients Stated Pain Goal: 3 (53/29/92 [id number])  Complications: No apparent anesthesia complications

## 2018-04-19 NOTE — Plan of Care (Signed)
  Problem: Education: Goal: Knowledge of General Education information will improve Outcome: Progressing   

## 2018-04-19 NOTE — Anesthesia Procedure Notes (Signed)
Procedure Name: MAC Date/Time: 04/19/2018 12:30 PM Performed by: Renato Shin, CRNA Pre-anesthesia Checklist: Patient identified, Emergency Drugs available, Suction available and Patient being monitored Patient Re-evaluated:Patient Re-evaluated prior to induction Oxygen Delivery Method: Simple face mask Preoxygenation: Pre-oxygenation with 100% oxygen Induction Type: IV induction Placement Confirmation: positive ETCO2,  CO2 detector and breath sounds checked- equal and bilateral Dental Injury: Teeth and Oropharynx as per pre-operative assessment

## 2018-04-19 NOTE — Progress Notes (Signed)
Orthopedic Tech Progress Note Patient Details:  Eric [name] [birth year] [id number]  Ortho Devices Type of Ortho Device: Bone foam zero knee Ortho Device/Splint Interventions: [name] 04/19/2018, 3:16 PM

## 2018-04-19 NOTE — Op Note (Signed)
PATIENT ID:      Eric [name]  MRN:     [id number] DOB/AGE:    [birth year] / 84 y.o.       OPERATIVE REPORT    DATE OF PROCEDURE:  04/19/2018       PREOPERATIVE DIAGNOSIS:   RIGHT KNEE OSTEOARTHRITIS      Estimated body mass index is 36.49 kg/m as calculated from the following:   Height as of this encounter: 5\' 8"  (1.727 m).   Weight as of this encounter: 240 lb (108.9 kg).                                                        POSTOPERATIVE DIAGNOSIS:   RIGHT KNEE OSTEOARTHRITIS                                                                      PROCEDURE:  Procedure(s): TOTAL KNEE ARTHROPLASTY Using DepuyAttune RP implants #7R Femur, #8Tibia, 6 mm Attune RP bearing, 41 Patella     SURGEON: Kerin Salen    ASSISTANT:   Kerry Hough. [employer]   (Present and scrubbed throughout the case, critical for assistance with exposure, retraction, instrumentation, and closure.)         ANESTHESIA: Spinal, 20cc Exparel, 50cc 0.25% Marcaine  EBL: 300cc  FLUID REPLACEMENT: 1600cc crystalloid  TOURNIQUET TIME: N/A  Drains: None  Tranexamic Acid: 1gm IV, 2gm topical  COMPLICATIONS:  None         INDICATIONS FOR PROCEDURE: The patient has  RIGHT KNEE OSTEOARTHRITIS, Var deformities, XR shows bone on bone arthritis, lateral subluxation of tibia. Patient has failed all conservative measures including anti-inflammatory medicines, narcotics, attempts at  exercise and weight loss, cortisone injections and viscosupplementation.  Risks and benefits of surgery have been discussed, questions answered.   DESCRIPTION OF PROCEDURE: The patient identified by armband, received  IV antibiotics, in the holding area at [facility]. Patient taken to the operating room, appropriate anesthetic  monitors were attached, and Spinal anesthesia was  induced. Tourniquet  applied high to the operative thigh. Lateral post and foot positioner  applied to the table, the lower extremity was then prepped and draped   in usual sterile fashion from the toes to the tourniquet. Time-out procedure was performed. We began the operation, with the knee flexed 120 degrees, by making the anterior midline incision starting at handbreadth above the patella going over the patella 1 cm medial to and 4 cm distal to the tibial tubercle. Small bleeders in the skin and the  subcutaneous tissue identified and cauterized. Transverse retinaculum was incised and reflected medially and a medial parapatellar arthrotomy was accomplished. the patella was everted and theprepatellar fat pad resected. The superficial medial collateral  ligament was then elevated from anterior to posterior along the proximal  flare of the tibia and anterior half of the menisci resected. The knee was hyperflexed exposing bone on bone arthritis. Peripheral and notch osteophytes as well as the cruciate ligaments were then resected. We continued to  work our way around posteriorly along the proximal  tibia, and externally  rotated the tibia subluxing it out from underneath the femur. A McHale  retractor was placed through the notch and a lateral Hohmann retractor  placed, and we then drilled through the proximal tibia in line with the  axis of the tibia followed by an intramedullary guide rod and 2-degree  posterior slope cutting guide. The tibial cutting guide, 3 degree posterior sloped, was pinned into place allowing resection of 3 mm of bone medially and 12 mm of bone laterally. Satisfied with the tibial resection, we then  entered the distal femur 2 mm anterior to the PCL origin with the  intramedullary guide rod and applied the distal femoral cutting guide  set at 9 mm, with 5 degrees of valgus. This was pinned along the  epicondylar axis. At this point, the distal femoral cut was accomplished without difficulty. We then sized for a #7R femoral component and pinned the guide in 3 degrees of external rotation. The chamfer cutting guide was pinned into place.  The anterior, posterior, and chamfer cuts were accomplished without difficulty followed by  the Attune RP box cutting guide and the box cut. We also removed posterior osteophytes from the posterior femoral condyles. At this  time, the knee was brought into full extension. We checked our  extension and flexion gaps and found them symmetric for a 6 mm bearing. Distracting in extension with a lamina spreader, the posterior horns of the menisci were removed, and Exparel, diluted to 60 cc, with 20cc NS, and 20cc 0.5% Marcaine,was injected into the capsule and synovium of the knee. The posterior patella cut was accomplished with the 9.5 mm Attune cutting guide, sized for a 47mm dome, and the fixation pegs drilled.The knee  was then once again hyperflexed exposing the proximal tibia. We sized for a # 8 tibial base plate, applied the smokestack and the conical reamer followed by the the Delta fin keel punch. We then hammered into place the Attune RP trial femoral component, drilled the lugs, inserted a  6 mm trial bearing, trial patellar button, and took the knee through range of motion from 0-130 degrees. No thumb pressure was required for patellar Tracking. At this point, the limb was wrapped with an Esmarch bandage and the tourniquet inflated to 350 mmHg. All trial components were removed, mating surfaces irrigated with pulse lavage, and dried with suction and sponges. 10 cc of the Exparel solution was applied to the cancellus bone of the patella distal femur and proximal tibia.  After waiting 1 minute, the bony surfaces were again, dried with sponges. A double batch of DePuy HV cement with 1500 mg of Zinacef was mixed and applied to all bony metallic mating surfaces except for the posterior condyles of the femur itself. In order, we hammered into place the tibial tray and removed excess cement, the femoral component and removed excess cement. The final Attune RP bearing  was inserted, and the knee brought to full  extension with compression.  The patellar button was clamped into place, and excess cement  removed. While the cement cured the wound was irrigated out with normal saline solution pulse lavage. Ligament stability and patellar tracking were checked and found to be excellent. The parapatellar arthrotomy was closed with  running #1 Vicryl suture. The subcutaneous tissue with 0 and 2-0 undyed  Vicryl suture, and the skin with running 3-0 SQ vicryl. A dressing of Xeroform,  4 x 4, dressing sponges, Webril, and Ace wrap applied. The patient  awakened, and  taken to recovery room without difficulty.   Kerin Salen 04/19/2018, 1:55 PM

## 2018-04-20 ENCOUNTER — Encounter (HOSPITAL_COMMUNITY): Payer: Self-pay | Admitting: Orthopedic Surgery

## 2018-04-20 LAB — CBC
HEMATOCRIT: 31.8 % — AB (ref 39.0–52.0)
Hemoglobin: 10.6 g/dL — ABNORMAL LOW (ref 13.0–17.0)
MCH: 32.4 pg (ref 26.0–34.0)
MCHC: 33.3 g/dL (ref 30.0–36.0)
MCV: 97.2 fL (ref 78.0–100.0)
Platelets: 203 10*3/uL (ref 150–400)
RBC: 3.27 MIL/uL — ABNORMAL LOW (ref 4.22–5.81)
RDW: 12.9 % (ref 11.5–15.5)
WBC: 12.7 10*3/uL — ABNORMAL HIGH (ref 4.0–10.5)

## 2018-04-20 LAB — BASIC METABOLIC PANEL
Anion gap: 5 (ref 5–15)
BUN: 25 mg/dL — AB (ref 6–20)
CALCIUM: 7.9 mg/dL — AB (ref 8.9–10.3)
CO2: 23 mmol/L (ref 22–32)
CREATININE: 1.62 mg/dL — AB (ref 0.61–1.24)
Chloride: 108 mmol/L (ref 101–111)
GFR calc Af Amer: 43 mL/min — ABNORMAL LOW (ref >60)
GFR calc non Af Amer: 37 mL/min — ABNORMAL LOW (ref >60)
GLUCOSE: 192 mg/dL — AB (ref 65–99)
Potassium: 4.6 mmol/L (ref 3.5–5.1)
Sodium: 136 mmol/L (ref 135–145)

## 2018-04-20 NOTE — Progress Notes (Signed)
Physical Therapy Treatment Patient Details Name: Eric [name] MRN: [id number] DOB: Apr 23, 1929 Today's Date: 04/20/2018    History of Present Illness Pt is an 84 y/o male s/p R TKA. PMH including but not limited to CKD and HTN.    PT Comments    Pt with decline in functional status as compared to previous session this AM. Pt with great difficulty with attempted ambulation. Pt with multiple LOB requiring mod A to maintain upright secondary to significant functional weakness and poor motor coordination/control of R LE. Additionally, pt with poor safety awareness and lack of insight about his deficits. Pt stating that he just needs "a good night's rest". Based on pt's current presentation, he appears to be at an increased risk for falls and would greatly benefit from further intensive therapy services at a SNF prior to returning home with family. PT will continue to follow pt acutely to progress mobility as tolerated and hopeful that pt will make progress. However, at this time pt remains very limited with mobility and requires min A for bed mobility, mod A for transfers and mod A to ambulate a very short distance within his room (3' this session as compared to ~20' in previous session).   R knee ROM, measured in sitting: Flexion = 95 degrees Extension = lacking 10 degrees to neutral     Follow Up Recommendations  Supervision/Assistance - 24 hour;SNF     Equipment Recommendations  None recommended by PT    Recommendations for Other Services       Precautions / Restrictions Precautions Precautions: Knee;Fall Precaution Booklet Issued: Yes (comment) Restrictions Weight Bearing Restrictions: Yes RLE Weight Bearing: Weight bearing as tolerated    Mobility  Bed Mobility Overal bed mobility: Needs Assistance Bed Mobility: Supine to Sit;Sit to Supine     Supine to sit: Min assist Sit to supine: Min assist   General bed mobility comments: increased time and effort, assist for  trunk elevation and to return bilateral LEs onto bed  Transfers Overall transfer level: Needs assistance Equipment used: Rolling walker (2 wheeled) Transfers: Sit to/from Stand Sit to Stand: Mod assist         General transfer comment: first attempt pt unable to achieve full standing, second attempt and mod A to power into standing from EOB; cueing for safety hand placement, assist for stability with transitional movement and for balance with standing as pt with R lateral lean  Ambulation/Gait Ambulation/Gait assistance: Mod assist Ambulation Distance (Feet): 3 Feet Assistive device: Rolling walker (2 wheeled) Gait Pattern/deviations: Step-to pattern;Decreased step length - right;Decreased step length - left;Decreased stride length;Shuffle(R lateral lean) Gait velocity: decreased Gait velocity interpretation: <1.8 ft/sec, indicate of risk for recurrent falls General Gait Details: pt with modest instability and great difficulty with advancing L LE forward this session; pt with significant R LE weakness and poor standing balance (static and dynamic). Pt with multiple LOB requiring mod A and pt completely unaware of safety and his deficits.   Stairs             Wheelchair Mobility    Modified Rankin (Stroke Patients Only)       Balance Overall balance assessment: Needs assistance Sitting-balance support: Feet supported Sitting balance-Leahy Scale: Poor Sitting balance - Comments: pt requiring min-mod A to maintain upright sitting balance with heavy posterior lean Postural control: Posterior lean Standing balance support: During functional activity;Bilateral upper extremity supported Standing balance-Leahy Scale: Poor Standing balance comment: R lateral lean; worked on lateral weight shifting and  achieving midline prior to ambulation; pt with great difficulty with motor coordination and body awareness                            Cognition Arousal/Alertness:  Awake/alert Behavior During Therapy: Impulsive Overall Cognitive Status: Impaired/Different from baseline Area of Impairment: Safety/judgement;Problem solving                         Safety/Judgement: Decreased awareness of deficits;Decreased awareness of safety   Problem Solving: Difficulty sequencing;Requires verbal cues        Exercises Total Joint Exercises Ankle Circles/Pumps: AROM;Both;20 reps;Supine Quad Sets: AROM;Strengthening;Right;10 reps;Supine Gluteal Sets: AROM;Strengthening;Both;10 reps;Supine Heel Slides: AROM;Strengthening;Right;10 reps;Supine Straight Leg Raises: AROM;Strengthening;Right;10 reps;Supine Long Arc Quad: AROM;Strengthening;Right;10 reps Goniometric ROM: Flexion: 95 degrees; Extension: lacking 10 degrees to neutral; measured in sitting Marching in Standing: AROM;Strengthening;Both;10 reps;Seated    General Comments        Pertinent Vitals/Pain Pain Assessment: Faces Pain Score: 7  Faces Pain Scale: Hurts even more Pain Location: R knee Pain Descriptors / Indicators: Sore Pain Intervention(s): Monitored during session;Repositioned    Home Living Family/patient expects to be discharged to:: Private residence Living Arrangements: Spouse/significant other Available Help at Discharge: Family;Available 24 hours/day Type of Home: House Home Access: Stairs to enter Entrance Stairs-Rails: Right Home Layout: One level Home Equipment: Clinical cytogeneticist - 2 wheels;Cane - single point;Walker - 4 wheels;Wheelchair - manual      Prior Function Level of Independence: Independent          PT Goals (current goals can now be found in the care plan section) Acute Rehab PT Goals Patient Stated Goal: return home ASAP PT Goal Formulation: With patient/family Time For Goal Achievement: 05/04/18 Potential to Achieve Goals: Good Progress towards PT goals: Progressing toward goals    Frequency    7X/week      PT Plan Discharge plan  needs to be updated    Co-evaluation              AM-PAC PT "6 Clicks" Daily Activity  Outcome Measure  Difficulty turning over in bed (including adjusting bedclothes, sheets and blankets)?: None Difficulty moving from lying on back to sitting on the side of the bed? : Unable Difficulty sitting down on and standing up from a chair with arms (e.g., wheelchair, bedside commode, etc,.)?: Unable Help needed moving to and from a bed to chair (including a wheelchair)?: A Lot Help needed walking in hospital room?: Total Help needed climbing 3-5 steps with a railing? : Total 6 Click Score: 10    End of Session Equipment Utilized During Treatment: Gait belt Activity Tolerance: Patient limited by fatigue Patient left: in bed;with call bell/phone within reach;with bed alarm set;with family/visitor present Nurse Communication: Mobility status PT Visit Diagnosis: Other abnormalities of gait and mobility (R26.89);Pain Pain - Right/Left: Right Pain - part of body: Knee     Time: 4098-1191 PT Time Calculation (min) (ACUTE ONLY): 23 min  Charges:  $Therapeutic Activity: 23-37 mins                    G Codes:       [place], [place], [place] 04/20/2018, 4:17 PM

## 2018-04-20 NOTE — Evaluation (Signed)
Physical Therapy Evaluation Patient Details Name: Eric [name] MRN: [id number] DOB: [birth year] Today's Date: 04/20/2018   History of Present Illness  Pt is an 84 y/o male s/p R TKA. PMH including but not limited to CKD and HTN.  Clinical Impression  Pt presented supine in bed with HOB elevated, awake and willing to participate in therapy session. Prior to admission, pt reported that he was independent with all functional mobility and ADLs. Pt currently requires min guard for bed mobility, min A for transfers with RW and min-mod A with short distance ambulation with RW. Pt with consistent R lateral lean with standing and ambulation that he is unable to correct without physical assistance. Hopeful that pt will make good, steady progress with mobility during hospitalization. Pt would continue to benefit from skilled physical therapy services at this time while admitted and after d/c to address the below listed limitations in order to improve overall safety and independence with functional mobility.     Follow Up Recommendations Supervision/Assistance - 24 hour;Home health PT    Equipment Recommendations  None recommended by PT    Recommendations for Other Services       Precautions / Restrictions Precautions Precautions: Knee;Fall Precaution Booklet Issued: Yes (comment) Restrictions Weight Bearing Restrictions: Yes RLE Weight Bearing: Weight bearing as tolerated      Mobility  Bed Mobility Overal bed mobility: Needs Assistance Bed Mobility: Supine to Sit;Sit to Supine     Supine to sit: Min guard Sit to supine: Min guard   General bed mobility comments: increased time and effort, min guard for safety  Transfers Overall transfer level: Needs assistance Equipment used: Rolling walker (2 wheeled) Transfers: Sit to/from Stand Sit to Stand: Min assist         General transfer comment: cueing for safety hand placement, min A for stability with transitional movement and  for balance with standing as pt with R lateral lean  Ambulation/Gait Ambulation/Gait assistance: Min assist;Mod assist Ambulation Distance (Feet): 20 Feet Assistive device: Rolling walker (2 wheeled) Gait Pattern/deviations: Step-to pattern;Step-through pattern;Decreased step length - right;Decreased step length - left;Decreased stride length;Decreased weight shift to left(R lateral lean) Gait velocity: decreased Gait velocity interpretation: <1.31 ft/sec, indicative of household ambulator General Gait Details: pt with modest instability and consistent R lateral lean towards his surgical LE with inability to correct without physical assistance  Stairs            Wheelchair Mobility    Modified Rankin (Stroke Patients Only)       Balance Overall balance assessment: Needs assistance Sitting-balance support: Feet supported Sitting balance-Leahy Scale: Good     Standing balance support: During functional activity;Bilateral upper extremity supported Standing balance-Leahy Scale: Poor Standing balance comment: R lateral lean                             Pertinent Vitals/Pain Pain Assessment: 0-10 Pain Score: 7  Pain Location: R knee Pain Descriptors / Indicators: Sore Pain Intervention(s): Monitored during session;Repositioned    Home Living Family/patient expects to be discharged to:: Private residence Living Arrangements: Spouse/significant other Available Help at Discharge: Family;Available 24 hours/day Type of Home: House Home Access: Stairs to enter Entrance Stairs-Rails: Right Entrance Stairs-Number of Steps: 3 Home Layout: One level Home Equipment: Clinical cytogeneticist - 2 wheels;Cane - single point;Walker - 4 wheels;Wheelchair - manual      Prior Function Level of Independence: Independent  Hand Dominance        Extremity/Trunk Assessment   Upper Extremity Assessment Upper Extremity Assessment: Overall WFL for tasks  assessed    Lower Extremity Assessment Lower Extremity Assessment: Generalized weakness;RLE deficits/detail RLE Deficits / Details: pt with decreased sensation and weakness secondary to post-op; pt with mild knee buckling with ambulation       Communication   Communication: No difficulties  Cognition Arousal/Alertness: Awake/alert Behavior During Therapy: Impulsive Overall Cognitive Status: Impaired/Different from baseline Area of Impairment: Safety/judgement;Problem solving                         Safety/Judgement: Decreased awareness of deficits;Decreased awareness of safety   Problem Solving: Difficulty sequencing;Requires verbal cues        General Comments      Exercises Total Joint Exercises Quad Sets: AROM;Strengthening;Right;10 reps;Supine Gluteal Sets: AROM;Strengthening;Both;10 reps;Supine Long Arc Quad: AROM;Strengthening;Right;10 reps Marching in Standing: AROM;Strengthening;Both;10 reps;Seated   Assessment/Plan    PT Assessment Patient needs continued PT services  PT Problem List Decreased strength;Decreased range of motion;Decreased activity tolerance;Decreased balance;Decreased mobility;Decreased coordination;Decreased knowledge of use of DME;Decreased safety awareness;Decreased knowledge of precautions;Pain       PT Treatment Interventions DME instruction;Gait training;Stair training;Functional mobility training;Therapeutic activities;Therapeutic exercise;Neuromuscular re-education;Balance training;Patient/family education    PT Goals (Current goals can be found in the Care Plan section)  Acute Rehab PT Goals Patient Stated Goal: return home ASAP PT Goal Formulation: With patient/family Time For Goal Achievement: 05/04/18 Potential to Achieve Goals: Good    Frequency 7X/week   Barriers to discharge        Co-evaluation               AM-PAC PT "6 Clicks" Daily Activity  Outcome Measure Difficulty turning over in bed (including  adjusting bedclothes, sheets and blankets)?: None Difficulty moving from lying on back to sitting on the side of the bed? : None Difficulty sitting down on and standing up from a chair with arms (e.g., wheelchair, bedside commode, etc,.)?: Unable Help needed moving to and from a bed to chair (including a wheelchair)?: A Little Help needed walking in hospital room?: A Little Help needed climbing 3-5 steps with a railing? : A Lot 6 Click Score: 17    End of Session Equipment Utilized During Treatment: Gait belt Activity Tolerance: Patient tolerated treatment well Patient left: in bed;with call bell/phone within reach;with family/visitor present;with SCD's reapplied Nurse Communication: Mobility status PT Visit Diagnosis: Other abnormalities of gait and mobility (R26.89);Pain Pain - Right/Left: Right Pain - part of body: Knee    Time: 1040-1103 PT Time Calculation (min) (ACUTE ONLY): 23 min   Charges:   PT Evaluation $PT Eval Moderate Complexity: 1 Mod PT Treatments $Therapeutic Activity: 8-22 mins   PT G Codes:        [place], PT, DPT [phone]   [place] 04/20/2018, 2:43 PM

## 2018-04-20 NOTE — Progress Notes (Addendum)
PATIENT ID: Eric [name]  MRN: [id number]  DOB/AGE:  August 06, 1929 / 84 y.o.  1 Day Post-Op Procedure(s) (LRB): TOTAL KNEE ARTHROPLASTY (Right)    PROGRESS NOTE Subjective: Patient is alert, oriented, no Nausea, no Vomiting, yes passing gas. Taking PO well. Denies SOB, Chest or Calf Pain. Using Incentive Spirometer, PAS in place. Ambulate Up to BR, Patient reports pain as 3/10 .    Objective: Vital signs in last 24 hours: Vitals:   04/19/18 1651 04/19/18 1707 04/19/18 1840 04/20/18 0423  BP: 133/77 127/69 126/68 (Abnormal) 92/56  Pulse: 82 73 86 96  Resp: 11 14 16 16   Temp:   98.1 F (36.7 C) 99.1 F (37.3 C)  TempSrc:   Oral Oral  SpO2: 98% 100% 100% 96%  Weight:      Height:          Intake/Output from previous day: I/O last 3 completed shifts: In: 8921 [P.O.:150; I.V.:1200] Out: 700 [Urine:550; Blood:150]   Intake/Output this shift: No intake/output data recorded.   LABORATORY DATA: Recent Labs    04/20/18 0426  WBC 12.7*  HGB 10.6*  HCT 31.8*  PLT 203  NA 136  K 4.6  CL 108  CO2 23  BUN 25*  CREATININE 1.62*  GLUCOSE 192*  CALCIUM 7.9*    Examination: Neurologically intact ABD soft Neurovascular intact Sensation intact distally Intact pulses distally Dorsiflexion/Plantar flexion intact Incision: dressing C/D/I No cellulitis present Compartment soft}  Assessment:   1 Day Post-Op Procedure(s) (LRB): TOTAL KNEE ARTHROPLASTY (Right) ADDITIONAL DIAGNOSIS: Expected Acute Blood Loss Anemia,  Anticipated LOS equal to or greater than 2 midnights due to - Age 60 and older with one or more of the following:  - Obesity, HTN, Stage 3 CRF  - Expected need for hospital services (PT, OT, Nursing) required for safe  discharge     Plan: PT/OT WBAT, AROM and PROM  DVT Prophylaxis:  SCDx72hrs, ASA 325 mg BID x 2 weeks DISCHARGE PLAN: Home DISCHARGE NEEDS: HHPT, Walker and 3-in-1 comode seat     Kerin Salen 04/20/2018, 7:33 AM

## 2018-04-20 NOTE — Care Plan (Signed)
Met with patient and wife at bedside. States he is ok. Did not sleep last night. He is planning to discharge to home when medically ready. He has all needed equipment at home. He will be seen by [facility] for [place]. He is scheduled to follow up with Dr. Mayer Camel in the office on 05/02/18 at 1015 and transition to OPPT at [facility] at 1120.  Patient and wife are agreeable to the above plan.   Please contact Ladell Heads, [place] with questions or if the above plan needs to change.    Thank you

## 2018-04-21 LAB — CBC
HEMATOCRIT: 32 % — AB (ref 39.0–52.0)
Hemoglobin: 10.5 g/dL — ABNORMAL LOW (ref 13.0–17.0)
MCH: 32.3 pg (ref 26.0–34.0)
MCHC: 32.8 g/dL (ref 30.0–36.0)
MCV: 98.5 fL (ref 78.0–100.0)
Platelets: 207 10*3/uL (ref 150–400)
RBC: 3.25 MIL/uL — ABNORMAL LOW (ref 4.22–5.81)
RDW: 13.3 % (ref 11.5–15.5)
WBC: 10 10*3/uL (ref 4.0–10.5)

## 2018-04-21 NOTE — Progress Notes (Signed)
Physical Therapy Treatment Patient Details Name: Eric [name] MRN: [id number] DOB: [birth year] Today's Date: 04/21/2018    History of Present Illness Pt is an 84 y/o male s/p R TKA. PMH including but not limited to CKD and HTN.    PT Comments    Pt continuing to have significantly limited functional mobility. He required min A for bed mobility, mod A for transfers with bed in elevated position, and mod A to take 4-5 side steps at EOB with RW. Pt sitting prematurely and impulsively with side stepping, stating that he was "weak and painful". Pt presenting with decreased strength and increased pain in R LE, limiting his mobility at this time. PT will continue to follow pt acutely and progress mobility as tolerated. Pt would continue to benefit from skilled physical therapy services at this time while admitted and after d/c to address the below listed limitations in order to improve overall safety and independence with functional mobility.     Follow Up Recommendations  Supervision/Assistance - 24 hour;SNF     Equipment Recommendations  None recommended by PT    Recommendations for Other Services       Precautions / Restrictions Precautions Precautions: Knee;Fall Precaution Booklet Issued: Yes (comment) Precaution Comments: Reiterated precautions with positioning following TKA surgery with pt as pt had foot of bed up which put his R knee in flexion; PT stressed the importance of keeping knee extended in bed to avoid any ROM restrictions Restrictions Weight Bearing Restrictions: Yes RLE Weight Bearing: Weight bearing as tolerated    Mobility  Bed Mobility Overal bed mobility: Needs Assistance Bed Mobility: Supine to Sit;Sit to Supine     Supine to sit: Min assist Sit to supine: Min assist   General bed mobility comments: increased time and effort, assist for trunk elevation and to return bilateral LEs onto bed  Transfers Overall transfer level: Needs assistance Equipment  used: Rolling walker (2 wheeled) Transfers: Sit to/from Stand Sit to Stand: Mod assist         General transfer comment: pt required bed in elevated position, use of momentum and mod A; pt performed sit<>stand x2  Ambulation/Gait Ambulation/Gait assistance: Mod assist           General Gait Details: pt only able to take side steps at EOB with RW and mod A; pt with great difficulty with foot clearance on L as pt with weakness and pain on R LE; cueing to use bilateral UEs on RW to off-weight R LE when attempting to move L LE   Stairs             Wheelchair Mobility    Modified Rankin (Stroke Patients Only)       Balance Overall balance assessment: Needs assistance Sitting-balance support: Feet supported;Bilateral upper extremity supported Sitting balance-Leahy Scale: Poor Sitting balance - Comments: close min guard to min A to maintain upright sitting at EOB Postural control: Posterior lean Standing balance support: During functional activity;Bilateral upper extremity supported Standing balance-Leahy Scale: Poor Standing balance comment: pt with improved midline standing position this session                             Cognition Arousal/Alertness: Awake/alert Behavior During Therapy: Impulsive Overall Cognitive Status: Impaired/Different from baseline Area of Impairment: Safety/judgement;Problem solving                         Safety/Judgement: Decreased awareness  of deficits;Decreased awareness of safety   Problem Solving: Difficulty sequencing;Requires verbal cues        Exercises Total Joint Exercises Ankle Circles/Pumps: AROM;Both;20 reps;Supine Short Arc Quad: AAROM;Right;10 reps;Supine Heel Slides: AAROM;Right;10 reps;Supine Hip ABduction/ADduction: AAROM;Right;10 reps;Supine    General Comments        Pertinent Vitals/Pain Pain Assessment: Faces Faces Pain Scale: Hurts whole lot Pain Location: R knee Pain Descriptors  / Indicators: Sore Pain Intervention(s): Monitored during session;Repositioned;Patient requesting pain meds-RN notified;RN gave pain meds during session    Home Living                      Prior Function            PT Goals (current goals can now be found in the care plan section) Acute Rehab PT Goals PT Goal Formulation: With patient/family Time For Goal Achievement: 05/04/18 Potential to Achieve Goals: Good Progress towards PT goals: Progressing toward goals    Frequency    7X/week      PT Plan Current plan remains appropriate    Co-evaluation              AM-PAC PT "6 Clicks" Daily Activity  Outcome Measure  Difficulty turning over in bed (including adjusting bedclothes, sheets and blankets)?: None Difficulty moving from lying on back to sitting on the side of the bed? : Unable Difficulty sitting down on and standing up from a chair with arms (e.g., wheelchair, bedside commode, etc,.)?: Unable Help needed moving to and from a bed to chair (including a wheelchair)?: A Lot Help needed walking in hospital room?: Total Help needed climbing 3-5 steps with a railing? : Total 6 Click Score: 10    End of Session Equipment Utilized During Treatment: Gait belt Activity Tolerance: Patient limited by pain;Patient limited by fatigue Patient left: in bed;with call bell/phone within reach;with bed alarm set;with family/visitor present Nurse Communication: Mobility status PT Visit Diagnosis: Other abnormalities of gait and mobility (R26.89);Pain Pain - Right/Left: Right Pain - part of body: Knee     Time: 5374-8270 PT Time Calculation (min) (ACUTE ONLY): 32 min  Charges:  $Therapeutic Exercise: 8-22 mins $Therapeutic Activity: 8-22 mins                    G Codes:       [place], [place], [place] [phone]    [place] 04/21/2018, 11:33 AM

## 2018-04-21 NOTE — Care Plan (Signed)
Met with patient and wife at bedside this afternoon. They are refusing SNF placement. Patient states that he is feeling better and thinks he can now ambulate better. I explained that he had to be able to walk to the bathroom and get himself out of the house in the event of an emergency. They both voiced understanding but stated they had lots of help at home and could manage there.  Patient has equal grips bilaterally and equal foot strength. He speak is normal and tongue is midline.  HHPT is set up. Plans to work with therapy again today if they return and would like to go home tomorrow.   MD updated.   Ladell Heads, RN CM  [phone]

## 2018-04-21 NOTE — Progress Notes (Signed)
Physical Therapy Treatment Patient Details Name: Eric [name] MRN: [id number] DOB: [birth year] Today's Date: 04/21/2018    History of Present Illness Pt is an 84 y/o male s/p R TKA. PMH including but not limited to CKD and HTN.    PT Comments    Pt making minor progress this session. He was able to ambulate 10' forwards in room with RW and mod A x2 with very close chair follow as pt is impulsive and sits prematurely when he feels fatigued/weak/painful. Pt remains very limited secondary to R LE pain and weakness. Pt still needs to be able to ambulate hallway distances and participate in stair training prior to discharge. Pt would greatly benefit from further intensive therapy services in a SNF upon d/c to maximize his safety and independence with functional mobility.    Follow Up Recommendations  Supervision/Assistance - 24 hour;SNF     Equipment Recommendations  None recommended by PT    Recommendations for Other Services       Precautions / Restrictions Precautions Precautions: Knee;Fall Precaution Booklet Issued: Yes (comment) Precaution Comments: PT again reiterated precautions with positioning following TKA surgery with pt as pt had foot of bed up which put his R knee in flexion; PT stressed the importance of keeping knee extended in bed to avoid any ROM restrictions Restrictions Weight Bearing Restrictions: Yes RLE Weight Bearing: Weight bearing as tolerated    Mobility  Bed Mobility Overal bed mobility: Needs Assistance Bed Mobility: Supine to Sit     Supine to sit: Min assist     General bed mobility comments: increased time and effort, assist for trunk elevation  Transfers Overall transfer level: Needs assistance Equipment used: Rolling walker (2 wheeled) Transfers: Sit to/from Stand Sit to Stand: Mod assist;+2 physical assistance;+2 safety/equipment;From elevated surface         General transfer comment: pt required bed in elevated position, use of  momentum and mod A; pt performed sit<>stand x2  Ambulation/Gait Ambulation/Gait assistance: Mod assist;+2 safety/equipment;+2 physical assistance(close chair follow) Ambulation Distance (Feet): 10 Feet Assistive device: Rolling walker (2 wheeled) Gait Pattern/deviations: Step-to pattern;Decreased step length - right;Decreased step length - left;Decreased stance time - right;Decreased stride length;Decreased weight shift to right Gait velocity: decreased Gait velocity interpretation: <1.8 ft/sec, indicate of risk for recurrent falls General Gait Details: pt required mod A x2 for ambulation with RW; frequent cueing for upright posture and safety as pt attempting to sit impulsively and prematurely; very close chair follow   Stairs             Wheelchair Mobility    Modified Rankin (Stroke Patients Only)       Balance Overall balance assessment: Needs assistance Sitting-balance support: Feet supported;Bilateral upper extremity supported Sitting balance-Leahy Scale: Poor Sitting balance - Comments: close min guard to min A to maintain upright sitting at EOB Postural control: Posterior lean Standing balance support: During functional activity;Bilateral upper extremity supported Standing balance-Leahy Scale: Poor Standing balance comment: pt with improved midline standing position this session                             Cognition Arousal/Alertness: Awake/alert Behavior During Therapy: Impulsive Overall Cognitive Status: Impaired/Different from baseline Area of Impairment: Safety/judgement;Problem solving                         Safety/Judgement: Decreased awareness of deficits;Decreased awareness of safety   Problem Solving: Difficulty sequencing;Requires  verbal cues        Exercises Total Joint Exercises Long Arc Quad: AAROM;Right;10 reps;Seated Knee Flexion: AAROM;Right;10 reps;Seated Goniometric ROM: Flexion = 90 degrees; Extension = lacking 10  degrees to neutral; measured in sitting    General Comments        Pertinent Vitals/Pain Pain Assessment: Faces Faces Pain Scale: Hurts little more Pain Location: R knee Pain Descriptors / Indicators: Sore Pain Intervention(s): Monitored during session;Repositioned    Home Living                      Prior Function            PT Goals (current goals can now be found in the care plan section) Acute Rehab PT Goals PT Goal Formulation: With patient/family Time For Goal Achievement: 05/04/18 Potential to Achieve Goals: Good Progress towards PT goals: Progressing toward goals    Frequency    7X/week      PT Plan Current plan remains appropriate    Co-evaluation              AM-PAC PT "6 Clicks" Daily Activity  Outcome Measure  Difficulty turning over in bed (including adjusting bedclothes, sheets and blankets)?: None Difficulty moving from lying on back to sitting on the side of the bed? : Unable Difficulty sitting down on and standing up from a chair with arms (e.g., wheelchair, bedside commode, etc,.)?: Unable Help needed moving to and from a bed to chair (including a wheelchair)?: A Lot Help needed walking in hospital room?: A Lot Help needed climbing 3-5 steps with a railing? : Total 6 Click Score: 11    End of Session Equipment Utilized During Treatment: Gait belt Activity Tolerance: Patient limited by pain;Patient limited by fatigue Patient left: in chair;with call bell/phone within reach;with family/visitor present;with chair alarm set Nurse Communication: Mobility status PT Visit Diagnosis: Other abnormalities of gait and mobility (R26.89);Pain Pain - Right/Left: Right Pain - part of body: Knee     Time: 8280-0349 PT Time Calculation (min) (ACUTE ONLY): 16 min  Charges:  $Gait Training: 8-22 mins                    G Codes:       [place], [place], [place] 04/21/2018, 3:31 PM

## 2018-04-21 NOTE — Plan of Care (Signed)
  Problem: Pain Managment: Goal: General experience of comfort will improve Outcome: Progressing   

## 2018-04-21 NOTE — Progress Notes (Signed)
PATIENT ID: Eric [name]  MRN: [id number]  DOB/AGE:  Jul 16, 1929 / 84 y.o.  2 Days Post-Op Procedure(s) (LRB): TOTAL KNEE ARTHROPLASTY (Right)    PROGRESS NOTE Subjective: Patient is alert, oriented, no Nausea, no Vomiting, yes passing gas. Taking PO well. Denies SOB, Chest or Calf Pain. Using Incentive Spirometer, PAS in place. Ambulate WBAT with pt walking 20 ft in first session and 3 feet in second, Patient reports pain as moderate.    Objective: Vital signs in last 24 hours: Vitals:   04/19/18 1840 04/20/18 0423 04/20/18 1353 04/20/18 2242  BP: 126/68 (!) 92/56 (!) 92/50 131/62  Pulse: 86 96 92 (!) 105  Resp: 16 16 18 18   Temp: 98.1 F (36.7 C) 99.1 F (37.3 C) (!) 97.5 F (36.4 C) 98.3 F (36.8 C)  TempSrc: Oral Oral Oral Oral  SpO2: 100% 96% 95% 95%  Weight:      Height:          Intake/Output from previous day: I/O last 3 completed shifts: In: 2268.3 [I.V.:2268.3] Out: 350 [Urine:350]   Intake/Output this shift: No intake/output data recorded.   LABORATORY DATA: Recent Labs    04/20/18 0426 04/21/18 0506  WBC 12.7* 10.0  HGB 10.6* 10.5*  HCT 31.8* 32.0*  PLT 203 207  NA 136  --   K 4.6  --   CL 108  --   CO2 23  --   BUN 25*  --   CREATININE 1.62*  --   GLUCOSE 192*  --   CALCIUM 7.9*  --     Examination: Neurologically intact Neurovascular intact Sensation intact distally Intact pulses distally Dorsiflexion/Plantar flexion intact Incision: dressing C/D/I and no drainage No cellulitis present Compartment soft}  Assessment:   2 Days Post-Op Procedure(s) (LRB): TOTAL KNEE ARTHROPLASTY (Right) ADDITIONAL DIAGNOSIS: Expected Acute Blood Loss Anemia,  Anticipated LOS equal to or greater than 2 midnights due to - Age 77 and older with one or more of the following:  - Obesity  - Expected need for hospital services (PT, OT, Nursing) required for safe  discharge  - Active co-morbidities: HTN and stage 3 kidney disease     Plan: PT/OT  WBAT, AROM and PROM  DVT Prophylaxis:  SCDx72hrs, Eliquis 2.5 mg BID x 2 weeks DISCHARGE PLAN: Home DISCHARGE NEEDS: HHPT, Walker and 3-in-1 comode seat     Joanell Rising 04/21/2018, 7:25 AM

## 2018-04-22 LAB — CBC
HCT: 31.1 % — ABNORMAL LOW (ref 39.0–52.0)
Hemoglobin: 10.4 g/dL — ABNORMAL LOW (ref 13.0–17.0)
MCH: 33.2 pg (ref 26.0–34.0)
MCHC: 33.4 g/dL (ref 30.0–36.0)
MCV: 99.4 fL (ref 78.0–100.0)
PLATELETS: 197 10*3/uL (ref 150–400)
RBC: 3.13 MIL/uL — AB (ref 4.22–5.81)
RDW: 13.5 % (ref 11.5–15.5)
WBC: 10 10*3/uL (ref 4.0–10.5)

## 2018-04-22 MED ORDER — APIXABAN (ELIQUIS) EDUCATION KIT FOR DVT/PE PATIENTS
PACK | Freq: Once | Status: DC
  Filled 2018-04-22: qty 1

## 2018-04-22 NOTE — Progress Notes (Signed)
Pt complaining pain,  ,med given will reassess

## 2018-04-22 NOTE — Care Management Note (Signed)
Case Management Note  Patient Details  Name: Eric [name] MRN: [id number] Date of Birth: 03-Jun-1929  Subjective/Objective:        Pt from home with wife for  TKA.  Pt set up with [facility] preoperatively and has RW and 3n1.           Action/Plan: Tiffany with [facility] notified of patient's d/c.   Expected Discharge Date:  04/22/18               Expected Discharge Plan:  [place]  In-House Referral:  NA  Discharge planning Services  CM Consult  Post Acute Care Choice:  Home Health Choice offered to:  NA(set up preoperatively)  DME Arranged:  (pt has RW and 3n1. No other DME needed) DME Agency:  NA  HH Arranged:  PT HH Agency:  [facility]  Status of Service:  Completed, signed off  If discussed at [employer] of Stay Meetings, dates discussed:    Additional Comments:  Claudie Leach, RN 04/22/2018, 11:44 AM

## 2018-04-22 NOTE — Plan of Care (Signed)
  Problem: Education: Goal: Knowledge of General Education information will improve Outcome: Progressing Note:  POC reviewed with pt.   

## 2018-04-22 NOTE — Progress Notes (Signed)
Pt alert and oriented x4, no complaints of pain or discomfort.  Bed in low position, call bell within reach.  Bed alarms on and functioning.  Assessment done and charted.  Will continue to monitor and do hourly rounding throughout the shift 

## 2018-04-22 NOTE — Progress Notes (Signed)
Pt in pain,  Med given will reassess in 30 min

## 2018-04-22 NOTE — Progress Notes (Signed)
Physical Therapy Treatment Patient Details Name: Eric [name] MRN: [id number] DOB: Apr 22, 1929 Today's Date: 04/22/2018    History of Present Illness Pt is an 84 y/o male s/p R TKA. PMH including but not limited to CKD and HTN.    PT Comments    Today's skilled session continued to focus on mobility post knee replacement. Overall pt needed less assistance this session than with previous sessions. Stair instruction completed this session. Pt and spouse report having multiple family members that plan to stay and assist them at home (grandson, daughter, etc). Pt needs to be seen for 2cd session today and if he continues to progress as he did this session he most likely will be safe to discharge home today with HHPT and family assistance.     Follow Up Recommendations  Supervision/Assistance - 24 hour;SNF(if does well with pm session will be safe for HHPT)     Equipment Recommendations  None recommended by PT    Precautions / Restrictions Precautions Precautions: Knee;Fall Precaution Comments: reinforced no pillows under knee Restrictions Weight Bearing Restrictions: Yes RLE Weight Bearing: Weight bearing as tolerated    Mobility  Bed Mobility Overal bed mobility: Needs Assistance       Supine to sit: Min assist     General bed mobility comments: with HOB flat, no rails. pt instructed to use belt (or sheet at home) to advance right LE to and off edge of bed. min HHA from PTA to elevate trunk into sitting at edge. pt then able to self scoot hips closer to edge of bed.   Transfers Overall transfer level: Needs assistance Equipment used: Rolling walker (2 wheeled)   Sit to Stand: Min assist         General transfer comment: cues needed on hand placement, weight shifting and rocking to gain momentum with cues to "power up" by though LE's. less assistance needed with 2cd stand from chair. cues needed each time with sitting to chair to fully align with surface prior to  sitting and to reach back, both for safety.                       Ambulation/Gait Ambulation/Gait assistance: Min guard Ambulation Distance (Feet): 8 Feet(x2 reps) Assistive device: Rolling walker (2 wheeled) Gait Pattern/deviations: Step-to pattern;Decreased step length - right;Decreased step length - left;Decreased stride length;Antalgic;Narrow base of support;Trunk flexed Gait velocity: decreased Gait velocity interpretation: <1.31 ft/sec, indicative of household ambulator General Gait Details: cues needed for posture, increased step length and general walker safety   Stairs Stairs: Yes Stairs assistance: Min guard Stair Management: One rail Right;Sideways Number of Stairs: 3 General stair comments: PTA demo'd technique prior to pt performance. reminder cues with min guard assist for safety needed duirng pt performance. Spouse present for education on stair mangagement as well.           Cognition  Arousal/Alertness: Awake/alert Behavior During Therapy: WFL for tasks assessed/performed;Impulsive Overall Cognitive Status: Within Functional Limits for tasks assessed              Problem Solving: Slow processing;Requires verbal cues        Exercises Total Joint Exercises Ankle Circles/Pumps: AROM;Strengthening;Both;10 reps;Supine Quad Sets: AROM;Strengthening;Right;10 reps;Supine Heel Slides: AAROM;Strengthening;Right;10 reps;Supine Straight Leg Raises: AROM;Strengthening;Right;10 reps;Supine Goniometric ROM: 5-90 degrees AROM right knee. extension measured in supine, flexion in sitting at edge of bed.     Pertinent Vitals/Pain Pain Assessment: 0-10 Pain Score: 5  Pain Descriptors / Indicators: Aching;Tightness;Sore;Operative site  guarding Pain Intervention(s): Limited activity within patient's tolerance;Monitored during session;Premedicated before session;Repositioned     PT Goals (current goals can now be found in the care plan section) Acute Rehab PT  Goals Patient Stated Goal: return home ASAP PT Goal Formulation: With patient/family Time For Goal Achievement: 05/04/18 Potential to Achieve Goals: Good Progress towards PT goals: Progressing toward goals    Frequency    7X/week      PT Plan Current plan remains appropriate    AM-PAC PT "6 Clicks" Daily Activity  Outcome Measure  Difficulty turning over in bed (including adjusting bedclothes, sheets and blankets)?: None Difficulty moving from lying on back to sitting on the side of the bed? : A Little   Help needed moving to and from a bed to chair (including a wheelchair)?: A Little Help needed walking in hospital room?: A Little Help needed climbing 3-5 steps with a railing? : A Little 6 Click Score: 16    End of Session Equipment Utilized During Treatment: Gait belt Activity Tolerance: Patient tolerated treatment well;No increased pain;Patient limited by fatigue Patient left: in chair;with call bell/phone within reach;with family/visitor present Nurse Communication: Mobility status PT Visit Diagnosis: Unsteadiness on feet (R26.81);Other abnormalities of gait and mobility (R26.89);Muscle weakness (generalized) (M62.81);Pain Pain - Right/Left: Right Pain - part of body: Knee     Time: 6213-0865 PT Time Calculation (min) (ACUTE ONLY): 29 min  Charges:  $Gait Training: 8-22 mins $Therapeutic Exercise: 8-22 mins                    Willow Ora, PTA, [facility] [employer] Office- [phone] 04/22/18, 11:49 AM  Willow Ora 04/22/2018, 11:46 AM

## 2018-04-22 NOTE — Discharge Summary (Signed)
Patient ID: Eric [name] MRN: [id number] DOB/AGE: 02-18-1929 84 y.o.  Admit date: 04/19/2018 Discharge date: 04/22/2018  Admission Diagnoses:  Principal Problem:   Osteoarthritis of right knee Active Problems:   Primary osteoarthritis of right knee   Discharge Diagnoses:  Same  Past Medical History:  Diagnosis Date  . BPH (benign prostatic hyperplasia)   . CKD (chronic kidney disease), stage III ([place])   . History of kidney stones   . Hypertension   . Osteoarthritis of right knee   . Spinal stenosis     Surgeries: Procedure(s):RIGHT TOTAL KNEE ARTHROPLASTY on 04/19/2018   Consultants:   Discharged Condition: Improved  Hospital Course: Eric [name] is an 84 y.o. male who was admitted 04/19/2018 for operative treatment ofOsteoarthritis of right knee. Patient has severe unremitting pain that affects sleep, daily activities, and work/hobbies. After pre-op clearance the patient was taken to the operating room on 04/19/2018 and underwent  Procedure(s):RIGHT TOTAL KNEE ARTHROPLASTY.    Patient was given perioperative antibiotics:  Anti-infectives (From admission, onward)   Start     Dose/Rate Route Frequency Ordered Stop   04/19/18 1051  ceFAZolin (ANCEF) 2-4 GM/100ML-% IVPB    Note to Pharmacy:  Jasmine Pang   : cabinet override      04/19/18 1051 04/19/18 1235   04/19/18 1046  ceFAZolin (ANCEF) IVPB 2g/100 mL premix     2 g 200 mL/hr over 30 Minutes Intravenous On call to O.R. 04/19/18 1046 04/19/18 1305       Patient was given sequential compression devices, early ambulation, and chemoprophylaxis to prevent DVT.  Patient benefited maximally from hospital stay and there were no complications.    Recent vital signs:  Patient Vitals for the past 24 hrs:  BP Temp Temp src Pulse Resp SpO2  04/22/18 0820 - 99.4 F (37.4 C) Oral (!) 113 - 95 %  04/22/18 0450 138/68 99.4 F (37.4 C) Oral (!) 106 20 98 %  04/21/18 2351 - 98.1 F (36.7 C) Oral - - -  04/21/18 2048  (!) 112/58 100.1 F (37.8 C) Oral (!) 110 20 95 %  04/21/18 1900 (!) 128/57 (!) 101.8 F (38.8 C) Oral (!) 111 - 100 %     Recent laboratory studies:  Recent Labs    04/20/18 0426 04/21/18 0506 04/22/18 0425  WBC 12.7* 10.0 10.0  HGB 10.6* 10.5* 10.4*  HCT 31.8* 32.0* 31.1*  PLT 203 207 197  NA 136  --   --   K 4.6  --   --   CL 108  --   --   CO2 23  --   --   BUN 25*  --   --   CREATININE 1.62*  --   --   GLUCOSE 192*  --   --   CALCIUM 7.9*  --   --      Discharge Medications:   Allergies as of 04/22/2018      Reactions   Nsaids Other (See Comments)   Renal insufficiency   Tolmetin Rash, Other (See Comments)   Renal insufficiency   Sulfa Antibiotics    Renal insufficiency at age 70      Medication List    STOP taking these medications   TYLENOL 8 HOUR ARTHRITIS PAIN 650 MG CR tablet Generic drug:  acetaminophen     TAKE these medications   apixaban 2.5 MG Tabs tablet Commonly known as:  ELIQUIS Take 1 tablet (2.5 mg total) by mouth 2 (two) times daily.  lisinopril 40 MG tablet Commonly known as:  PRINIVIL,ZESTRIL Take 40 mg by mouth daily.   oxyCODONE-acetaminophen 5-325 MG tablet Commonly known as:  PERCOCET/ROXICET Take 1 tablet by mouth every 4 (four) hours as needed for severe pain.   tiZANidine 2 MG tablet Commonly known as:  ZANAFLEX Take 1 tablet (2 mg total) by mouth every 6 (six) hours as needed.            Durable Medical Equipment  (From admission, onward)        Start     Ordered   04/19/18 1809  DME Walker rolling  Once    Question:  Patient needs a walker to treat with the following condition  Answer:  Status post right knee replacement   04/19/18 1808   04/19/18 1809  DME 3 n 1  Once     04/19/18 1808       Discharge Care Instructions  (From admission, onward)        Start     Ordered   04/22/18 0000  Weight bearing as tolerated    Question Answer Comment  Laterality right   Extremity Lower      04/22/18  0854      Diagnostic Studies: Dg Chest 2 View  Result Date: 04/07/2018 CLINICAL DATA:  Preop total knee replacement EXAM: CHEST - 2 VIEW COMPARISON:  09/02/2009 FINDINGS: Heart and mediastinal contours are within normal limits. No focal opacities or effusions. No acute bony abnormality. IMPRESSION: No active cardiopulmonary disease. Electronically Signed   By: Rolm Baptise M.D.   On: 04/07/2018 11:47    Disposition: Discharge disposition: 01-Home or Self Care       Discharge Instructions    Call MD / Call 911   Complete by:  As directed    If you experience chest pain or shortness of breath, CALL 911 and be transported to the hospital emergency room.  If you develope a fever above 101 F, pus (white drainage) or increased drainage or redness at the wound, or calf pain, call your surgeon's office.   Diet general   Complete by:  As directed    Do not put a pillow under the knee. Place it under the heel.   Complete by:  As directed    Increase activity slowly as tolerated   Complete by:  As directed    Weight bearing as tolerated   Complete by:  As directed    Laterality:  right   Extremity:  Lower      Follow-up Information    Frederik Pear, MD In 2 weeks.   Specialty:  Orthopedic Surgery Contact information: [place] [address] [phone]        [facility] Follow up.   Specialty:  [place] Why:  A representative from [facility] will contact you to arrange start date and time for your therapy. Contact information: [address] [place] [address] [phone]            Signed: Erlene Senters 04/22/2018, 1:09 PM

## 2018-04-22 NOTE — Progress Notes (Addendum)
Subjective: 3 Days Post-Op Procedure(s) (LRB): TOTAL KNEE ARTHROPLASTY (Right) Patient reports pain as mild.  Taking by mouth and voiding okay.  Wants to go home today.  Wife at bedside.  No complaints.  Objective: Vital signs in last 24 hours: Temp:  [98.1 F (36.7 C)-101.8 F (38.8 C)] 99.4 F (37.4 C) (05/11 0820) Pulse Rate:  [106-113] 113 (05/11 0820) Resp:  [20] 20 (05/11 0450) BP: (112-138)/(57-68) 138/68 (05/11 0450) SpO2:  [95 %-100 %] 95 % (05/11 0820)  Intake/Output from previous day: 05/10 0701 - 05/11 0700 In: -  Out: 875 [Urine:875] Intake/Output this shift: No intake/output data recorded.  Recent Labs    04/20/18 0426 04/21/18 0506 04/22/18 0425  HGB 10.6* 10.5* 10.4*   Recent Labs    04/21/18 0506 04/22/18 0425  WBC 10.0 10.0  RBC 3.25* 3.13*  HCT 32.0* 31.1*  PLT 207 197   Recent Labs    04/20/18 0426  NA 136  K 4.6  CL 108  CO2 23  BUN 25*  CREATININE 1.62*  GLUCOSE 192*  CALCIUM 7.9*   No results for input(s): LABPT, INR in the last 72 hours. Right knee exam: Neurologically intact Neurovascular intact Sensation intact distally Intact pulses distally Dorsiflexion/Plantar flexion intact Incision: dressing C/D/I Compartment soft   ADDITIONAL DIAGNOSIS: Expected Acute Blood Loss Anemia,  Anticipated LOS equal to or greater than 2 midnights due to - Age 4 and older with one or more of the following:             - Obesity             - Expected need for hospital services (PT, OT, Nursing) required for safe   discharge             - Active co-morbidities: HTN and stage 3 kidney disease        Assessment/Plan: 3 Days Post-Op Procedure(s) (LRB): TOTAL KNEE ARTHROPLASTY (Right) Plan: Aspirin 81 mg twice daily for DVT prophylaxis x1 month postop. Discharge home with home health PT today if does well with physical therapy. Follow-up with Dr. Mayer Camel in 2 weeks.     Erlene Senters 04/22/2018, 8:51 AM

## 2018-04-22 NOTE — Progress Notes (Addendum)
Physical Therapy Treatment Patient Details Name: Eric [name] MRN: [id number] DOB: September 09, 1929 Today's Date: 04/22/2018    History of Present Illness Pt is an 84 y/o male s/p R TKA. PMH including but not limited to CKD and HTN.    PT Comments    Pt received in recliner and agreeable to participation in therapy. Pt required min assist sit to stand and min assist ambulation 5 feet with RW. Pt completed LE TKA exercises in recliner following mobility. He fatigues quickly. Plan is for d/c home today with family. Spoke to pt and wife at length regarding discharge. Pt adamant about discharging home today. Pt/wife verbalize no concerns. Pt/wife state multi family members (including pt's daughter who is an Therapist, sports) are staying with them upon d/c and are able to provide needed level of assist.  Wife also reports they have a wheelchair and transport chair to assist into the house, if needed. All education complete. All questions concerns addressed.     Follow Up Recommendations  Supervision/Assistance - 24 hour;Follow surgeon's recommendation for DC plan and follow-up therapies     Equipment Recommendations  None recommended by PT    Recommendations for Other Services       Precautions / Restrictions Precautions Precautions: Knee;Fall Precaution Comments: reinforced no pillows under knee Restrictions RLE Weight Bearing: Weight bearing as tolerated    Mobility  Bed Mobility Overal bed mobility: Needs Assistance       Supine to sit: Min assist     General bed mobility comments: Pt received in recliner.   Transfers Overall transfer level: Needs assistance Equipment used: Rolling walker (2 wheeled) Transfers: Sit to/from Stand Sit to Stand: Min assist         General transfer comment: Verbal cues for hand placement. Pt used momentum to stand. Increased time to stabilize initial standing balance. Assist for anterior translation of weight due to pt with all his weight back in his  heels.   Ambulation/Gait Ambulation/Gait assistance: Min assist Ambulation Distance (Feet): 5 Feet Assistive device: Rolling walker (2 wheeled) Gait Pattern/deviations: Step-to pattern;Decreased stance time - right;Decreased stride length;Trunk flexed Gait velocity: decreased Gait velocity interpretation: <1.31 ft/sec, indicative of household ambulator General Gait Details: cues for posture and to stay close to RW. Assist with lateral weight shift toward left to progress RLE through swing phase.    Stairs Stairs: Yes Stairs assistance: Min guard Stair Management: One rail Right;Sideways Number of Stairs: 3 General stair comments: PTA demo'd technique prior to pt performance. reminder cues with min guard assist for safety needed duirng pt performance. Spouse present for education on stair mangagement as well.    Wheelchair Mobility    Modified Rankin (Stroke Patients Only)       Balance Overall balance assessment: Needs assistance Sitting-balance support: Feet supported;No upper extremity supported Sitting balance-Leahy Scale: Fair     Standing balance support: Bilateral upper extremity supported;During functional activity Standing balance-Leahy Scale: Poor Standing balance comment: reliant on RW                            Cognition Arousal/Alertness: Awake/alert Behavior During Therapy: WFL for tasks assessed/performed;Impulsive Overall Cognitive Status: Within Functional Limits for tasks assessed                               Problem Solving: Slow processing;Requires verbal cues        Exercises Total  Joint Exercises Ankle Circles/Pumps: AROM;Both;10 reps Quad Sets: AROM;Right;10 reps Heel Slides: AAROM;Right;10 reps Hip ABduction/ADduction: AAROM;Right;10 reps Straight Leg Raises: AROM;Strengthening;Right;10 reps;Supine Goniometric ROM: 5-90 degrees AROM right knee. extension measured in supine, flexion in sitting at edge of bed.     General Comments        Pertinent Vitals/Pain Pain Assessment: 0-10 Pain Score: 4  Pain Location: R knee Pain Descriptors / Indicators: Operative site guarding;Sore;Tightness Pain Intervention(s): Monitored during session;Limited activity within patient's tolerance;Ice applied;Premedicated before session    Home Living                      Prior Function            PT Goals (current goals can now be found in the care plan section) Acute Rehab PT Goals Patient Stated Goal: return home ASAP PT Goal Formulation: With patient/family Time For Goal Achievement: 05/04/18 Potential to Achieve Goals: Good Progress towards PT goals: Progressing toward goals    Frequency    7X/week      PT Plan Discharge plan needs to be updated    Co-evaluation              AM-PAC PT "6 Clicks" Daily Activity  Outcome Measure  Difficulty turning over in bed (including adjusting bedclothes, sheets and blankets)?: None Difficulty moving from lying on back to sitting on the side of the bed? : A Little Difficulty sitting down on and standing up from a chair with arms (e.g., wheelchair, bedside commode, etc,.)?: Unable Help needed moving to and from a bed to chair (including a wheelchair)?: A Little Help needed walking in hospital room?: A Little Help needed climbing 3-5 steps with a railing? : A Little 6 Click Score: 17    End of Session Equipment Utilized During Treatment: Gait belt Activity Tolerance: Patient tolerated treatment well Patient left: in chair;with call bell/phone within reach;with family/visitor present Nurse Communication: Mobility status PT Visit Diagnosis: Unsteadiness on feet (R26.81);Other abnormalities of gait and mobility (R26.89);Muscle weakness (generalized) (M62.81);Pain Pain - Right/Left: Right Pain - part of body: Knee     Time: 1410-1437 PT Time Calculation (min) (ACUTE ONLY): 27 min  Charges:  $Gait Training: 8-22 mins $Therapeutic  Exercise: 8-22 mins                    G Codes:       Lorrin Goodell, PT  Office # [phone] Pager [phone]    Eric [name] 04/22/2018, 3:10 PM

## 2018-05-02 ENCOUNTER — Other Ambulatory Visit: Payer: Self-pay | Admitting: Orthopedic Surgery

## 2018-05-02 ENCOUNTER — Ambulatory Visit
Admission: RE | Admit: 2018-05-02 | Discharge: 2018-05-02 | Disposition: A | Discharge status: Home / Self Care | Payer: Medicare Other | Source: Ambulatory Visit | Attending: Orthopedic Surgery | Admitting: Orthopedic Surgery

## 2018-05-02 DIAGNOSIS — M79604 Pain in right leg: Secondary | ICD-10-CM

## 2018-05-02 DIAGNOSIS — R609 Edema, unspecified: Secondary | ICD-10-CM

## 2018-05-05 ENCOUNTER — Other Ambulatory Visit: Payer: Self-pay

## 2018-05-05 ENCOUNTER — Inpatient Hospital Stay (HOSPITAL_COMMUNITY)
Admission: EM | Admit: 2018-05-05 | Discharge: 2018-05-08 | DRG: 378 | Disposition: A | Discharge status: Home / Self Care | Payer: Medicare Other | Attending: Internal Medicine | Admitting: Internal Medicine

## 2018-05-05 ENCOUNTER — Encounter (HOSPITAL_COMMUNITY): Payer: Self-pay

## 2018-05-05 DIAGNOSIS — N3000 Acute cystitis without hematuria: Secondary | ICD-10-CM | POA: Diagnosis present

## 2018-05-05 DIAGNOSIS — D500 Iron deficiency anemia secondary to blood loss (chronic): Secondary | ICD-10-CM | POA: Diagnosis present

## 2018-05-05 DIAGNOSIS — E860 Dehydration: Secondary | ICD-10-CM | POA: Diagnosis present

## 2018-05-05 DIAGNOSIS — Z79899 Other long term (current) drug therapy: Secondary | ICD-10-CM

## 2018-05-05 DIAGNOSIS — Z96651 Presence of right artificial knee joint: Secondary | ICD-10-CM | POA: Diagnosis present

## 2018-05-05 DIAGNOSIS — K922 Gastrointestinal hemorrhage, unspecified: Secondary | ICD-10-CM | POA: Diagnosis present

## 2018-05-05 DIAGNOSIS — Z87891 Personal history of nicotine dependence: Secondary | ICD-10-CM | POA: Diagnosis not present

## 2018-05-05 DIAGNOSIS — N183 Chronic kidney disease, stage 3 (moderate): Secondary | ICD-10-CM | POA: Diagnosis present

## 2018-05-05 DIAGNOSIS — Z7901 Long term (current) use of anticoagulants: Secondary | ICD-10-CM | POA: Diagnosis not present

## 2018-05-05 DIAGNOSIS — K210 Gastro-esophageal reflux disease with esophagitis: Secondary | ICD-10-CM | POA: Diagnosis present

## 2018-05-05 DIAGNOSIS — Z87440 Personal history of urinary (tract) infections: Secondary | ICD-10-CM

## 2018-05-05 DIAGNOSIS — R197 Diarrhea, unspecified: Secondary | ICD-10-CM

## 2018-05-05 DIAGNOSIS — N390 Urinary tract infection, site not specified: Secondary | ICD-10-CM | POA: Diagnosis present

## 2018-05-05 DIAGNOSIS — Z96642 Presence of left artificial hip joint: Secondary | ICD-10-CM | POA: Diagnosis present

## 2018-05-05 DIAGNOSIS — I129 Hypertensive chronic kidney disease with stage 1 through stage 4 chronic kidney disease, or unspecified chronic kidney disease: Secondary | ICD-10-CM | POA: Diagnosis present

## 2018-05-05 DIAGNOSIS — K921 Melena: Principal | ICD-10-CM | POA: Diagnosis present

## 2018-05-05 DIAGNOSIS — N400 Enlarged prostate without lower urinary tract symptoms: Secondary | ICD-10-CM | POA: Diagnosis present

## 2018-05-05 DIAGNOSIS — N179 Acute kidney failure, unspecified: Secondary | ICD-10-CM | POA: Diagnosis present

## 2018-05-05 DIAGNOSIS — B9620 Unspecified Escherichia coli [E. coli] as the cause of diseases classified elsewhere: Secondary | ICD-10-CM | POA: Diagnosis present

## 2018-05-05 LAB — COMPREHENSIVE METABOLIC PANEL
ALK PHOS: 68 U/L (ref 38–126)
ALT: 15 U/L — ABNORMAL LOW (ref 17–63)
ANION GAP: 8 (ref 5–15)
AST: 17 U/L (ref 15–41)
Albumin: 2.7 g/dL — ABNORMAL LOW (ref 3.5–5.0)
BILIRUBIN TOTAL: 0.8 mg/dL (ref 0.3–1.2)
BUN: 38 mg/dL — ABNORMAL HIGH (ref 6–20)
CALCIUM: 8.4 mg/dL — AB (ref 8.9–10.3)
CO2: 23 mmol/L (ref 22–32)
Chloride: 103 mmol/L (ref 101–111)
Creatinine, Ser: 1.93 mg/dL — ABNORMAL HIGH (ref 0.61–1.24)
GFR, EST AFRICAN AMERICAN: 35 mL/min — AB (ref >60)
GFR, EST NON AFRICAN AMERICAN: 30 mL/min — AB (ref >60)
Glucose, Bld: 121 mg/dL — ABNORMAL HIGH (ref 65–99)
POTASSIUM: 3.9 mmol/L (ref 3.5–5.1)
Sodium: 134 mmol/L — ABNORMAL LOW (ref 135–145)
TOTAL PROTEIN: 5.6 g/dL — AB (ref 6.5–8.1)

## 2018-05-05 LAB — CBC WITH DIFFERENTIAL/PLATELET
ABS IMMATURE GRANULOCYTES: 0 10*3/uL (ref 0.0–0.1)
BASOS ABS: 0 10*3/uL (ref 0.0–0.1)
BASOS PCT: 0 %
Eosinophils Absolute: 0.2 10*3/uL (ref 0.0–0.7)
Eosinophils Relative: 2 %
HCT: 29.3 % — ABNORMAL LOW (ref 39.0–52.0)
Hemoglobin: 9.6 g/dL — ABNORMAL LOW (ref 13.0–17.0)
IMMATURE GRANULOCYTES: 0 %
Lymphocytes Relative: 8 %
Lymphs Abs: 0.8 10*3/uL (ref 0.7–4.0)
MCH: 32.5 pg (ref 26.0–34.0)
MCHC: 32.8 g/dL (ref 30.0–36.0)
MCV: 99.3 fL (ref 78.0–100.0)
Monocytes Absolute: 0.8 10*3/uL (ref 0.1–1.0)
Monocytes Relative: 7 %
NEUTROS PCT: 83 %
Neutro Abs: 8.4 10*3/uL — ABNORMAL HIGH (ref 1.7–7.7)
PLATELETS: 378 10*3/uL (ref 150–400)
RBC: 2.95 MIL/uL — AB (ref 4.22–5.81)
RDW: 12.7 % (ref 11.5–15.5)
WBC: 10.3 10*3/uL (ref 4.0–10.5)

## 2018-05-05 LAB — URINALYSIS, ROUTINE W REFLEX MICROSCOPIC
GLUCOSE, UA: NEGATIVE mg/dL
KETONES UR: 5 mg/dL — AB
NITRITE: NEGATIVE
PH: 5 (ref 5.0–8.0)
Protein, ur: 100 mg/dL — AB
Specific Gravity, Urine: 1.018 (ref 1.005–1.030)
WBC, UA: >50 WBC/hpf — ABNORMAL HIGH (ref 0–5)

## 2018-05-05 LAB — CBC
HCT: 26.7 % — ABNORMAL LOW (ref 39.0–52.0)
Hemoglobin: 8.7 g/dL — ABNORMAL LOW (ref 13.0–17.0)
MCH: 32 pg (ref 26.0–34.0)
MCHC: 32.6 g/dL (ref 30.0–36.0)
MCV: 98.2 fL (ref 78.0–100.0)
PLATELETS: 340 10*3/uL (ref 150–400)
RBC: 2.72 MIL/uL — ABNORMAL LOW (ref 4.22–5.81)
RDW: 12.8 % (ref 11.5–15.5)
WBC: 11.7 10*3/uL — ABNORMAL HIGH (ref 4.0–10.5)

## 2018-05-05 LAB — POC OCCULT BLOOD, ED: Fecal Occult Bld: POSITIVE — AB

## 2018-05-05 LAB — I-STAT CG4 LACTIC ACID, ED: Lactic Acid, Venous: 1.6 mmol/L (ref 0.5–1.9)

## 2018-05-05 MED ORDER — SODIUM CHLORIDE 0.9 % IV SOLN
INTRAVENOUS | Status: DC
  Administered 2018-05-05 – 2018-05-07 (×3): via INTRAVENOUS

## 2018-05-05 MED ORDER — CEFTRIAXONE SODIUM 1 G IJ SOLR
1.0000 g | Freq: Once | INTRAMUSCULAR | Status: AC
  Administered 2018-05-05: 1 g via INTRAVENOUS
  Filled 2018-05-05: qty 10

## 2018-05-05 MED ORDER — ACETAMINOPHEN 500 MG PO TABS: 500.0000 mg | ORAL_TABLET | Freq: Four times a day (QID) | ORAL | Status: DC | PRN

## 2018-05-05 MED ORDER — ONDANSETRON HCL 4 MG/2ML IJ SOLN: 4.0000 mg | Freq: Four times a day (QID) | INTRAMUSCULAR | Status: DC | PRN

## 2018-05-05 MED ORDER — WHITE PETROLATUM EX OINT
TOPICAL_OINTMENT | CUTANEOUS | Status: AC
  Administered 2018-05-06: 01:00:00
  Filled 2018-05-05: qty 28.35

## 2018-05-05 MED ORDER — ONDANSETRON HCL 4 MG PO TABS
4.0000 mg | ORAL_TABLET | Freq: Four times a day (QID) | ORAL | Status: DC | PRN
Start: 2018-05-05 — End: 2018-05-08

## 2018-05-05 MED ORDER — FAMOTIDINE IN NACL 20-0.9 MG/50ML-% IV SOLN
20.0000 mg | Freq: Two times a day (BID) | INTRAVENOUS | Status: DC
  Administered 2018-05-05: 20 mg via INTRAVENOUS
  Filled 2018-05-05 (×2): qty 50

## 2018-05-05 MED ORDER — SODIUM CHLORIDE 0.9 % IV BOLUS
500.0000 mL | Freq: Once | INTRAVENOUS | Status: AC
  Administered 2018-05-05: 500 mL via INTRAVENOUS

## 2018-05-05 NOTE — ED Notes (Signed)
Attempted to use sunquest for label . Pop up said "could not perform more than one action."  Would not print label

## 2018-05-05 NOTE — ED Provider Notes (Signed)
[facility] EMERGENCY DEPARTMENT Provider Note   CSN: [id number] Arrival date & time: 05/05/18  1105     History   Chief Complaint Chief Complaint  Patient presents with  . Abnormal Lab    HPI Eric [name] is a 84 y.o. male.  Patient with history of BPH, chronic kidney disease, right knee replacement on 5/8 --presents with complaint of diarrhea, also elevated creatinine and lower hemoglobin.  Patient has had sticky diarrhea without blood noted, prior to his surgery.  Patient states that he received a "cleanout" at the time of his surgery which stopped his stooling for 3 to 4 days.  Since about the 12th, he has had persistent stooling, voluminous.  He has not noted blood or melena.  He has not had any associated abdominal pains or vomiting.  He has not been having any shortness of breath or chest pains.  No fever.  He denies recent antibiotic use.  He is on apixaban.  He takes lisinopril for blood pressure.  Patient had lab work done by his primary care doctor yesterday.  He was told to come to the hospital for low hemoglobin, high white blood cell count, and concern for dehydration. Stool test by PCP was negative per wife. The onset of this condition was acute. The course is constant. Aggravating factors: none. Alleviating factors: none.   Patient states that his knee is healing well.  He continues to have some minor pain behind the knee but is walking well and is happy with the result of his surgery to this point.  Wife states that he had ultrasounds of his legs which did not show any blood clots after surgery.     Past Medical History:  Diagnosis Date  . BPH (benign prostatic hyperplasia)   . CKD (chronic kidney disease), stage III ([place])   . History of kidney stones   . Hypertension   . Osteoarthritis of right knee   . Spinal stenosis     Patient Active Problem List   Diagnosis Date Noted  . Primary osteoarthritis of right knee 04/19/2018  .  Osteoarthritis of right knee 04/18/2018  . Benign hypertension with chronic kidney disease, stage III ([place]) 03/31/2018  . Choroidal nevus of left eye 01/18/2017  . Benign prostatic hyperplasia 11/19/2015  . Hypertensive retinopathy of both eyes 11/19/2015  . Spinal stenosis 11/19/2015    Past Surgical History:  Procedure Laterality Date  . CARPAL TUNNEL RELEASE Right 09/29/2016   Procedure: CARPAL TUNNEL RELEASE;  Surgeon: Frederik Pear, MD;  Location: [facility];  Service: Orthopedics;  Laterality: Right;  . cataract surgery Bilateral ~15 yrs ago  . EYE SURGERY     Bilateral  . HIP ARTHROPLASTY     Left  . JOINT REPLACEMENT     hip  . TONSILLECTOMY    . TOTAL KNEE ARTHROPLASTY Right 04/19/2018   Procedure: TOTAL KNEE ARTHROPLASTY;  Surgeon: Frederik Pear, MD;  Location: [facility];  Service: Orthopedics;  Laterality: Right;        Home Medications    Prior to Admission medications   Medication Sig Start Date End Date Taking? Authorizing Provider  apixaban (ELIQUIS) 2.5 MG TABS tablet Take 1 tablet (2.5 mg total) by mouth 2 (two) times daily. 04/19/18   Leighton Parody, PA-C  lisinopril (PRINIVIL,ZESTRIL) 40 MG tablet Take 40 mg by mouth daily.    [provider]  oxyCODONE-acetaminophen (PERCOCET/ROXICET) 5-325 MG tablet Take 1 tablet by mouth every 4 (four) hours  as needed for severe pain. 04/19/18   Leighton Parody, PA-C  tiZANidine (ZANAFLEX) 2 MG tablet Take 1 tablet (2 mg total) by mouth every 6 (six) hours as needed. 04/19/18   Leighton Parody, PA-C    Family History No family history on file.  Social History Social History   Tobacco Use  . Smoking status: Former Smoker    Types: Cigarettes  . Smokeless tobacco: Never Used  . Tobacco comment: Quit smoking 50+ years ago  Substance Use Topics  . Alcohol use: No  . Drug use: No     Allergies   Nsaids; Tolmetin; and Sulfa antibiotics   Review of Systems Review of Systems  Constitutional:  Negative for fever.  HENT: Negative for rhinorrhea and sore throat.   Eyes: Negative for redness.  Respiratory: Negative for cough.   Cardiovascular: Negative for chest pain.  Gastrointestinal: Positive for diarrhea. Negative for abdominal pain, nausea and vomiting.  Genitourinary: Negative for dysuria.  Musculoskeletal: Negative for myalgias.  Skin: Negative for rash.  Neurological: Negative for headaches.     Physical Exam Updated Vital Signs BP 125/63 (BP Location: Left Arm)   Pulse (!) 112   Temp 98.2 F (36.8 C) (Oral)   Resp 16   Ht 5\' 9"  (1.753 m)   Wt 103.9 kg (229 lb)   SpO2 100%   BMI 33.82 kg/m   Physical Exam  Constitutional: He appears well-developed and well-nourished.  HENT:  Head: Normocephalic and atraumatic.  Eyes: Conjunctivae are normal. Right eye exhibits no discharge. Left eye exhibits no discharge.  Neck: Normal range of motion. Neck supple.  Cardiovascular: Normal rate and normal heart sounds.  No murmur heard. Mild tachycardia, low 100s.  Pulmonary/Chest: Effort normal and breath sounds normal. No stridor. No respiratory distress. He has no wheezes.  Abdominal: Soft. There is no tenderness. There is no rebound and no guarding.  Neurological: He is alert.  Skin: Skin is warm and dry.  Right knee surgical site appears to be healing very well.  Psychiatric: He has a normal mood and affect.  Nursing note and vitals reviewed.    ED Treatments / Results  Labs (all labs ordered are listed, but only abnormal results are displayed) Labs Reviewed  COMPREHENSIVE METABOLIC PANEL - Abnormal; Notable for the following components:      Result Value   Sodium 134 (*)    Glucose, Bld 121 (*)    BUN 38 (*)    Creatinine, Ser 1.93 (*)    Calcium 8.4 (*)    Total Protein 5.6 (*)    Albumin 2.7 (*)    ALT 15 (*)    GFR calc non Af Amer 30 (*)    GFR calc Af Amer 35 (*)    All other components within normal limits  CBC WITH DIFFERENTIAL/PLATELET -  Abnormal; Notable for the following components:   RBC 2.95 (*)    Hemoglobin 9.6 (*)    HCT 29.3 (*)    Neutro Abs 8.4 (*)    All other components within normal limits  URINALYSIS, ROUTINE W REFLEX MICROSCOPIC - Abnormal; Notable for the following components:   APPearance TURBID (*)    Hgb urine dipstick MODERATE (*)    Bilirubin Urine SMALL (*)    Ketones, ur 5 (*)    Protein, ur 100 (*)    Leukocytes, UA MODERATE (*)    WBC, UA >50 (*)    Bacteria, UA MANY (*)    Non Squamous Epithelial  11-20 (*)    All other components within normal limits  POC OCCULT BLOOD, ED - Abnormal; Notable for the following components:   Fecal Occult Bld POSITIVE (*)    All other components within normal limits  GASTROINTESTINAL PANEL BY PCR, STOOL (REPLACES STOOL CULTURE)  URINE CULTURE  I-STAT CG4 LACTIC ACID, ED  I-STAT CG4 LACTIC ACID, ED   ED ECG REPORT   Date: 05/05/2018  Rate: 105  Rhythm: sinus tachycardia  QRS Axis: normal  Intervals: normal  ST/T Wave abnormalities: normal  Conduction Disutrbances:none  Narrative Interpretation:   Old EKG Reviewed: changes noted from 04/07/18, faster today.   I have personally reviewed the EKG tracing and agree with the computerized printout as noted.  Radiology No results found.  Procedures Procedures (including critical care time)  Medications Ordered in ED Medications  sodium chloride 0.9 % bolus 500 mL (0 mLs Intravenous Stopped 05/05/18 1928)  cefTRIAXone (ROCEPHIN) 1 g in sodium chloride 0.9 % 100 mL IVPB (0 g Intravenous Stopped 05/05/18 2059)     Initial Impression / Assessment and Plan / ED Course  I have reviewed the triage vital signs and the nursing notes.  Pertinent labs & imaging results that were available during my care of the patient were reviewed by me and considered in my medical decision making (see chart for details).     Patient seen and examined.  Initial examined hallway.  Hydration ordered.  Will move to room and  check orthostatics as well as Hemoccult.  Patient has not had a colonoscopy in the past.  Vital signs reviewed and are as follows: BP 125/63 (BP Location: Left Arm)   Pulse (!) 112   Temp 98.2 F (36.8 C) (Oral)   Resp 16   Ht 5\' 9"  (1.753 m)   Wt 103.9 kg (229 lb)   SpO2 100%   BMI 33.82 kg/m   6:18 PM Heme + stool, AKI (hydrating), urine appears grossly infected. Plan to admit. Will speak with unassigned IM.   Spoke with [facility] who will see.   Final Clinical Impressions(s) / ED Diagnoses   Final diagnoses:  Gastrointestinal hemorrhage, unspecified gastrointestinal hemorrhage type  AKI (acute kidney injury) ([place])  Acute cystitis without hematuria  Diarrhea, unspecified type   Admit for above.   ED Discharge Orders    None       Carlisle Cater, Hershal Coria 05/05/18 2147    Isla Pence, MD 05/05/18 2221

## 2018-05-05 NOTE — ED Notes (Signed)
Attempted to call report x 1  

## 2018-05-05 NOTE — ED Triage Notes (Signed)
Pt arrived with family from home. States that pt was seen at PCP with c/o diarrhea that is brown and "sticky" PCP called family back and told them to have him evaluated at ED r/t low hgb, high WBC, and concern for dehydration. Pt had recent right knee surgery on May 8th.

## 2018-05-05 NOTE — H&P (Signed)
History and Physical   [facility] [place] [facility] [place] Admission History and Physical [employer], D.O.    Patient Name: Eric [name] MR#: [id number] Date of Birth: October 15, 1929 Date of Admission: 05/05/2018  Referring MD/NP/PA: PA Alecia Lemming Primary Care Physician: Irish Lack., PA-C  Chief Complaint:  Chief Complaint  Patient presents with  . Abnormal Lab    HPI: Eric [name] is a 84 y.o. male with a known history of BPH, CKD3, HTN, recent right TKR 04/19/18 presents to the emergency department for evaluation of anemia.  Patient was referred to the ED by his PMD for anemia and AKI.  Patient has been on Eliquis following TKR on 04/19/18 and reports "sticky" diarrhea since that hospitalization.  Diarrhea is profuse, foul-smelling and watery and is associated with incontinence.  Denies recent antibiotic use. He did have some persistent postop leg swelling and pain for which a doppler ruled out DVT this past week.   He has been ambulatory.   Of note he has had chronic urinary tract problems including recurrent UTIs and BPH.  Otherwise there has been no change in status. Patient has been taking medication as prescribed and there has been no recent change in medication or diet.    Patient denies fevers/chills, weakness, dizziness, chest pain, shortness of breath, abdominal pain, dysuria/frequency, changes in mental status.   EMS/ED Course: Patient received Rocephin, NS. Medical admission has been requested for further management of GI bleed, AKI and UTI.   Review of Systems:  CONSTITUTIONAL: No fever/chills, fatigue, weakness, weight gain/loss, headache. EYES: No blurry or double vision. ENT: No tinnitus, postnasal drip, redness or soreness of the oropharynx. RESPIRATORY: No cough, dyspnea, wheeze.  No hemoptysis.  CARDIOVASCULAR: No chest pain, palpitations, syncope, orthopnea. No lower extremity edema.  GASTROINTESTINAL: Positive diarrhea. No nausea, vomiting,  abdominal pain, constipation.  No hematemesis, melena or hematochezia. GENITOURINARY: No dysuria, frequency, hematuria. ENDOCRINE: No polyuria or nocturia. No heat or cold intolerance. HEMATOLOGY: No anemia, bruising, bleeding. INTEGUMENTARY: No rashes, ulcers, lesions. MUSCULOSKELETAL: No arthritis, gout. NEUROLOGIC: No numbness, tingling, ataxia, seizure-type activity, weakness. PSYCHIATRIC: No anxiety, depression, insomnia.   Past Medical History:  Diagnosis Date  . BPH (benign prostatic hyperplasia)   . CKD (chronic kidney disease), stage III ([place])   . History of kidney stones   . Hypertension   . Osteoarthritis of right knee   . Spinal stenosis     Past Surgical History:  Procedure Laterality Date  . CARPAL TUNNEL RELEASE Right 09/29/2016   Procedure: CARPAL TUNNEL RELEASE;  Surgeon: Frederik Pear, MD;  Location: [facility];  Service: Orthopedics;  Laterality: Right;  . cataract surgery Bilateral ~15 yrs ago  . EYE SURGERY     Bilateral  . HIP ARTHROPLASTY     Left  . JOINT REPLACEMENT     hip  . TONSILLECTOMY    . TOTAL KNEE ARTHROPLASTY Right 04/19/2018   Procedure: TOTAL KNEE ARTHROPLASTY;  Surgeon: Frederik Pear, MD;  Location: [facility];  Service: Orthopedics;  Laterality: Right;     reports that he has quit smoking. His smoking use included cigarettes. He has never used smokeless tobacco. He reports that he does not drink alcohol or use drugs.  Allergies  Allergen Reactions  . Nsaids Other (See Comments)    Renal insufficiency   . Sulfa Antibiotics Other (See Comments)    Renal insufficiency at age 59  . Tolmetin Rash and Other (See Comments)    Renal insufficiency  No family history on file.  Prior to Admission medications   Medication Sig Start Date End Date Taking? Authorizing Provider  apixaban (ELIQUIS) 2.5 MG TABS tablet Take 1 tablet (2.5 mg total) by mouth 2 (two) times daily. 04/19/18  Yes Joanell Rising K, PA-C  lisinopril  (PRINIVIL,ZESTRIL) 40 MG tablet Take 40 mg by mouth daily.    [provider]  oxyCODONE-acetaminophen (PERCOCET/ROXICET) 5-325 MG tablet Take 1 tablet by mouth every 4 (four) hours as needed for severe pain. 04/19/18   Leighton Parody, PA-C  tiZANidine (ZANAFLEX) 2 MG tablet Take 1 tablet (2 mg total) by mouth every 6 (six) hours as needed. 04/19/18   Leighton Parody, PA-C    Physical Exam: Vitals:   05/05/18 1800 05/05/18 1815 05/05/18 1915 05/05/18 1945  BP: 117/63 133/70 (!) 120/59 (!) 111/53  Pulse: (!) 105 (!) 103 (!) 102 (!) 105  Resp: 11 14 10 12   Temp:      TempSrc:      SpO2: 97% 100% 100% 97%  Weight:      Height:        GENERAL: 84 y.o.-year-old male patient, well-developed, well-nourished lying in the bed in no acute distress.  Pleasant and cooperative.   HEENT: Head atraumatic, normocephalic. Pupils equal. Mucus membranes moist. NECK: Supple. No JVD. CHEST: Normal breath sounds bilaterally. No wheezing, rales, rhonchi or crackles. No use of accessory muscles of respiration.  No reproducible chest wall tenderness.  CARDIOVASCULAR: SEM at LSB Cap refill <2 seconds. Pulses intact distally.  ABDOMEN: Soft, nondistended, nontender. No rebound, guarding, rigidity. Normoactive bowel sounds present in all four quadrants.  EXTREMITIES: Right knee wound well healing and without redness or warmth  No pedal edema, cyanosis, or clubbing. No calf tenderness or Homan's sign.  NEUROLOGIC: The patient is alert and oriented x 3. Cranial nerves II through XII are grossly intact with no focal sensorimotor deficit. PSYCHIATRIC:  Normal affect, mood, thought content. SKIN: Warm, dry, and intact without obvious rash, lesion, or ulcer.    Labs on Admission:  CBC: Recent Labs  Lab 05/05/18 1207  WBC 10.3  NEUTROABS 8.4*  HGB 9.6*  HCT 29.3*  MCV 99.3  PLT 657   Basic Metabolic Panel: Recent Labs  Lab 05/05/18 1207  NA 134*  K 3.9  CL 103  CO2 23  GLUCOSE 121*  BUN 38*   CREATININE 1.93*  CALCIUM 8.4*   GFR: Estimated Creatinine Clearance: 32.6 mL/min (A) (by C-G formula based on SCr of 1.93 mg/dL (H)). Liver Function Tests: Recent Labs  Lab 05/05/18 1207  AST 17  ALT 15*  ALKPHOS 68  BILITOT 0.8  PROT 5.6*  ALBUMIN 2.7*   No results for input(s): LIPASE, AMYLASE in the last 168 hours. No results for input(s): AMMONIA in the last 168 hours. Coagulation Profile: No results for input(s): INR, PROTIME in the last 168 hours. Cardiac Enzymes: No results for input(s): CKTOTAL, CKMB, CKMBINDEX, TROPONINI in the last 168 hours. BNP (last 3 results) No results for input(s): PROBNP in the last 8760 hours. HbA1C: No results for input(s): HGBA1C in the last 72 hours. CBG: No results for input(s): GLUCAP in the last 168 hours. Lipid Profile: No results for input(s): CHOL, HDL, LDLCALC, TRIG, CHOLHDL, LDLDIRECT in the last 72 hours. Thyroid Function Tests: No results for input(s): TSH, T4TOTAL, FREET4, T3FREE, THYROIDAB in the last 72 hours. Anemia Panel: No results for input(s): VITAMINB12, FOLATE, FERRITIN, TIBC, IRON, RETICCTPCT in the last 72 hours. Urine analysis:  Component Value Date/Time   COLORURINE YELLOW 05/05/2018 1815   APPEARANCEUR TURBID (A) 05/05/2018 1815   LABSPEC 1.018 05/05/2018 1815   PHURINE 5.0 05/05/2018 1815   GLUCOSEU NEGATIVE 05/05/2018 1815   HGBUR MODERATE (A) 05/05/2018 1815   BILIRUBINUR SMALL (A) 05/05/2018 1815   KETONESUR 5 (A) 05/05/2018 1815   PROTEINUR 100 (A) 05/05/2018 1815   UROBILINOGEN 1.0 01/08/2009 1441   NITRITE NEGATIVE 05/05/2018 1815   LEUKOCYTESUR MODERATE (A) 05/05/2018 1815   Sepsis Labs: @LABRCNTIP (procalcitonin:4,lacticidven:4) )No results found for this or any previous visit (from the past 240 hour(s)).   Radiological Exams on Admission: No results found.   Assessment/Plan  This is a 84 y.o. male with a history of BPH, CKD3, HTN, recent right TKR 04/19/18 now being admitted  with:  #. Anemia 2/2 GI Bleed - IV Protonix 40mg  IV BID - Serial CBCs - Nothing by mouth - IV fluid hydration - Hold Eliquis  #. Acute kidney injury likely 2/2 dehydration - IV fluids and repeat BMP in AM.  - Avoid nephrotoxic medications - Bladder scan and place foley catheter if evidence of urinary retention  #. UTI - Continue Rocephin initiated in ED.  #. History of HTN - Hold lisinopril  Admission status: Inpatient IV Fluids: NS Diet/Nutrition: NPO Consults called: GI  DVT Px: SCDs and early ambulation. Code Status: Full Code  Disposition Plan: To home in 2-3 days  All the records are reviewed and case discussed with ED provider. Management plans discussed with the patient and/or family who express understanding and agree with plan of care.  Keandrea Tapley D.O. on 05/05/2018 at 8:27 PM CC: Primary care physician; Irish Lack., PA-C   05/05/2018, 8:27 PM

## 2018-05-06 DIAGNOSIS — N179 Acute kidney failure, unspecified: Secondary | ICD-10-CM

## 2018-05-06 LAB — BASIC METABOLIC PANEL
Anion gap: 7 (ref 5–15)
BUN: 38 mg/dL — AB (ref 6–20)
CHLORIDE: 107 mmol/L (ref 101–111)
CO2: 23 mmol/L (ref 22–32)
CREATININE: 1.85 mg/dL — AB (ref 0.61–1.24)
Calcium: 8.1 mg/dL — ABNORMAL LOW (ref 8.9–10.3)
GFR calc Af Amer: 36 mL/min — ABNORMAL LOW (ref >60)
GFR calc non Af Amer: 31 mL/min — ABNORMAL LOW (ref >60)
Glucose, Bld: 101 mg/dL — ABNORMAL HIGH (ref 65–99)
Potassium: 4.5 mmol/L (ref 3.5–5.1)
SODIUM: 137 mmol/L (ref 135–145)

## 2018-05-06 LAB — C DIFFICILE QUICK SCREEN W PCR REFLEX
C DIFFICILE (CDIFF) TOXIN: NEGATIVE
C Diff antigen: NEGATIVE
C Diff interpretation: NOT DETECTED

## 2018-05-06 LAB — CBC
HCT: 24.6 % — ABNORMAL LOW (ref 39.0–52.0)
HEMATOCRIT: 25.1 % — AB (ref 39.0–52.0)
HEMATOCRIT: 24.6 % — AB (ref 39.0–52.0)
HEMOGLOBIN: 8.2 g/dL — AB (ref 13.0–17.0)
Hemoglobin: 8.4 g/dL — ABNORMAL LOW (ref 13.0–17.0)
Hemoglobin: 8.1 g/dL — ABNORMAL LOW (ref 13.0–17.0)
MCH: 32.9 pg (ref 26.0–34.0)
MCH: 33.1 pg (ref 26.0–34.0)
MCH: 33.1 pg (ref 26.0–34.0)
MCHC: 33.5 g/dL (ref 30.0–36.0)
MCHC: 32.9 g/dL (ref 30.0–36.0)
MCHC: 33.3 g/dL (ref 30.0–36.0)
MCV: 98.4 fL (ref 78.0–100.0)
MCV: 100.4 fL — AB (ref 78.0–100.0)
MCV: 99.2 fL (ref 78.0–100.0)
PLATELETS: 305 10*3/uL (ref 150–400)
Platelets: 322 10*3/uL (ref 150–400)
Platelets: 308 10*3/uL (ref 150–400)
RBC: 2.55 MIL/uL — ABNORMAL LOW (ref 4.22–5.81)
RBC: 2.45 MIL/uL — AB (ref 4.22–5.81)
RBC: 2.48 MIL/uL — ABNORMAL LOW (ref 4.22–5.81)
RDW: 12.7 % (ref 11.5–15.5)
RDW: 12.7 % (ref 11.5–15.5)
RDW: 12.7 % (ref 11.5–15.5)
WBC: 9.8 10*3/uL (ref 4.0–10.5)
WBC: 9.4 10*3/uL (ref 4.0–10.5)
WBC: 8 10*3/uL (ref 4.0–10.5)

## 2018-05-06 LAB — GASTROINTESTINAL PANEL BY PCR, STOOL (REPLACES STOOL CULTURE)
ASTROVIRUS: NOT DETECTED
Adenovirus F40/41: NOT DETECTED
CRYPTOSPORIDIUM: NOT DETECTED
CYCLOSPORA CAYETANENSIS: NOT DETECTED
Campylobacter species: NOT DETECTED
ENTAMOEBA HISTOLYTICA: NOT DETECTED
ENTEROTOXIGENIC E COLI (ETEC): NOT DETECTED
Enteroaggregative E coli (EAEC): NOT DETECTED
Enteropathogenic E coli (EPEC): NOT DETECTED
Giardia lamblia: NOT DETECTED
Norovirus GI/GII: NOT DETECTED
Plesimonas shigelloides: NOT DETECTED
Rotavirus A: NOT DETECTED
SAPOVIRUS (I, II, IV, AND V): NOT DETECTED
Salmonella species: NOT DETECTED
Shiga like toxin producing E coli (STEC): NOT DETECTED
Shigella/Enteroinvasive E coli (EIEC): NOT DETECTED
VIBRIO CHOLERAE: NOT DETECTED
VIBRIO SPECIES: NOT DETECTED
YERSINIA ENTEROCOLITICA: NOT DETECTED

## 2018-05-06 LAB — PROTIME-INR
INR: 1.33
PROTHROMBIN TIME: 16.3 s — AB (ref 11.4–15.2)

## 2018-05-06 LAB — APTT: aPTT: 30 seconds (ref 24–36)

## 2018-05-06 LAB — PREPARE RBC (CROSSMATCH)

## 2018-05-06 MED ORDER — SODIUM CHLORIDE 0.9 % IV SOLN
1.0000 g | Freq: Once | INTRAVENOUS | Status: AC
  Administered 2018-05-06: 1 g via INTRAVENOUS
  Filled 2018-05-06: qty 10

## 2018-05-06 MED ORDER — SODIUM CHLORIDE 0.9 % IV SOLN
8.0000 mg/h | INTRAVENOUS | Status: DC
  Administered 2018-05-06: 8 mg/h via INTRAVENOUS
  Filled 2018-05-06 (×6): qty 80

## 2018-05-06 MED ORDER — SODIUM CHLORIDE 0.9 % IV SOLN
Freq: Once | INTRAVENOUS | Status: AC
  Administered 2018-05-06: 11:00:00 via INTRAVENOUS

## 2018-05-06 MED ORDER — PANTOPRAZOLE SODIUM 40 MG IV SOLR: 40.0000 mg | Freq: Two times a day (BID) | INTRAVENOUS | Status: DC

## 2018-05-06 MED ORDER — SODIUM CHLORIDE 0.9 % IV SOLN
80.0000 mg | Freq: Once | INTRAVENOUS | Status: AC
  Administered 2018-05-06: 80 mg via INTRAVENOUS
  Filled 2018-05-06: qty 80

## 2018-05-06 NOTE — Evaluation (Signed)
Physical Therapy Evaluation Patient Details Name: Eric [name] MRN: [id number] DOB: 06-09-1929 Today's Date: 05/06/2018   History of Present Illness  Pt is a 84 y.o. M with significant PMH of BPH, CKD3, HTN, recent right TKR 04/19/18 who presents fo evaluation of anemia and AKI.  Clinical Impression  Patient with noticeable improvement since previous stay. Since prior admission, has progressed to household ambulation with no assistive device and is independent with ADL's. Able to ambulate 30 feet with RW and min guard assist. Presents with decreased mobility secondary to diminished endurance and functional strength deficits. Will continue to follow acutely for gait training, range of motion exercises, and strengthening. Recommend continued HHPT at discharge.    Follow Up Recommendations Home health PT    Equipment Recommendations  None recommended by PT    Recommendations for Other Services       Precautions / Restrictions Precautions Precautions: Knee;Fall Restrictions Weight Bearing Restrictions: No      Mobility  Bed Mobility Overal bed mobility: Modified Independent             General bed mobility comments: increased time for supine to sit  Transfers Overall transfer level: Needs assistance Equipment used: Rolling walker (2 wheeled) Transfers: Sit to/from Stand Sit to Stand: Min guard         General transfer comment: VC's for hand placement. Slight posterior lean with transition  Ambulation/Gait Ambulation/Gait assistance: Min guard Ambulation Distance (Feet): 30 Feet Assistive device: Rolling walker (2 wheeled) Gait Pattern/deviations: Step-through pattern;Trunk flexed;Decreased stride length Gait velocity: decreased   General Gait Details: Patient deferring ambulation in hall due to diarrhea but ambulating in room with RW and able to negotiate around furniture with no additional assistance. Min guard for safety.  Stairs            Wheelchair  Mobility    Modified Rankin (Stroke Patients Only)       Balance Overall balance assessment: Needs assistance Sitting-balance support: Feet supported;No upper extremity supported Sitting balance-Leahy Scale: Good     Standing balance support: No upper extremity supported;During functional activity Standing balance-Leahy Scale: Fair Standing balance comment: able to maintain static standing with no UE support with supervision                             Pertinent Vitals/Pain Pain Assessment: No/denies pain    Home Living Family/patient expects to be discharged to:: Private residence Living Arrangements: Spouse/significant other Available Help at Discharge: Family;Available 24 hours/day Type of Home: House Home Access: Stairs to enter Entrance Stairs-Rails: Right Entrance Stairs-Number of Steps: 3 Home Layout: One level Home Equipment: Clinical cytogeneticist - 2 wheels;Cane - single point;Walker - 4 wheels;Wheelchair - manual      Prior Function Level of Independence: Needs assistance   Gait / Transfers Assistance Needed: occasional use of RW for household ambulation  ADL's / Homemaking Assistance Needed: Assist for IADL's. Patient wife notes the last 1-2 days required assist with ADL's secondary to weaknesss        Hand Dominance        Extremity/Trunk Assessment   Upper Extremity Assessment Upper Extremity Assessment: Overall WFL for tasks assessed    Lower Extremity Assessment Lower Extremity Assessment: Overall WFL for tasks assessed RLE Deficits / Details: 5/5 strength througout. Did note lacking terminal knee extension but not formally measured    Cervical / Trunk Assessment Cervical / Trunk Assessment: Kyphotic  Communication   Communication: No  difficulties  Cognition Arousal/Alertness: Awake/alert Behavior During Therapy: WFL for tasks assessed/performed Overall Cognitive Status: Within Functional Limits for tasks assessed                                         General Comments General comments (skin integrity, edema, etc.): patient wife present throughout    Exercises Total Joint Exercises Long Arc Quad: Right;10 reps;Seated   Assessment/Plan    PT Assessment Patient needs continued PT services  PT Problem List Decreased strength;Decreased range of motion;Decreased activity tolerance;Decreased balance;Decreased mobility;Decreased coordination       PT Treatment Interventions DME instruction;Gait training;Stair training;Functional mobility training;Therapeutic activities;Therapeutic exercise;Neuromuscular re-education;Balance training;Patient/family education    PT Goals (Current goals can be found in the Care Plan section)  Acute Rehab PT Goals Patient Stated Goal: feel stronger PT Goal Formulation: With patient/family Time For Goal Achievement: 05/20/18 Potential to Achieve Goals: Good    Frequency Min 3X/week   Barriers to discharge        Co-evaluation               AM-PAC PT "6 Clicks" Daily Activity  Outcome Measure Difficulty turning over in bed (including adjusting bedclothes, sheets and blankets)?: None Difficulty moving from lying on back to sitting on the side of the bed? : A Little Difficulty sitting down on and standing up from a chair with arms (e.g., wheelchair, bedside commode, etc,.)?: A Little Help needed moving to and from a bed to chair (including a wheelchair)?: A Little Help needed walking in hospital room?: A Little Help needed climbing 3-5 steps with a railing? : A Little 6 Click Score: 19    End of Session Equipment Utilized During Treatment: Gait belt Activity Tolerance: Patient tolerated treatment well Patient left: in bed;with call bell/phone within reach;with family/visitor present;with nursing/sitter in room   PT Visit Diagnosis: Unsteadiness on feet (R26.81);Difficulty in walking, not elsewhere classified (R26.2)    Time: 2094-7096 PT Time  Calculation (min) (ACUTE ONLY): 20 min   Charges:   PT Evaluation $PT Eval Moderate Complexity: 1 Mod     PT G Codes:        Ellamae Sia, PT, DPT Acute Rehabilitation Services  Pager: [place] 05/06/2018, 11:52 AM

## 2018-05-06 NOTE — Progress Notes (Signed)
PROGRESS NOTE    Eric [name]  KVQ:[id number] DOB: September 26, 1929 DOA: 05/05/2018 PCP: Irish Lack., PA-C   Brief Narrative: 84 y.o. male with a known history of BPH, CKD3, HTN, recent right TKR 04/19/18 presents to the emergency department for evaluation of anemia.  Patient was referred to the ED by his PMD for anemia and AKI.  Patient has been on Eliquis following TKR on 04/19/18 and reports "sticky" diarrhea since that hospitalization.  Diarrhea is profuse, foul-smelling and watery and is associated with incontinence.  Denies recent antibiotic use. He did have some persistent postop leg swelling and pain for which a doppler ruled out DVT this past week.   He has been ambulatory.   Of note he has had chronic urinary tract problems including recurrent UTIs and BPH.  Otherwise there has been no change in status. Patient has been taking medication as prescribed and there has been no recent change in medication or diet.    Patient denies fevers/chills, weakness, dizziness, chest pain, shortness of breath, abdominal pain, dysuria/frequency, changes in mental status.   EMS/ED Course: Patient received Rocephin, NS. Medical admission has been requested for further management of GI bleed, AKI and UTI.      Assessment & Plan:   Active Problems:   GI bleed   1] upper GI bleed patient recently started on Eliquis after knee surgery.  Eliquis is being on hold.  Start IV Protonix drip.  His hemoglobin on admission was 9.6 and has come down to 8.1.  He has not received any blood transfusion.  When I was in the room with the patient he had a huge black loose bowel movement.  He is FOBT positive.  Called in a GI consult to [facility].  2] AKI/CKD stage III continue hydration follow-up labs creatinine improving.  3] history of hypertension lisinopril is on hold blood pressure stable.  4] history of recent right total knee replacement 04/19/2018  5] history of BPH and recurrent UTI his UA appears  dirty but urine culture is pending.    DVT prophylaxis:SCD Code Status:FULL Family Communication:DW WIFE Disposition Plan:tbd Consultants:  Dr schooler called  Procedures:none Antimicrobials:none  Subjective: Complaining of loose black bowel movements this morning.   Objective: Vitals:   05/05/18 2045 05/05/18 2153 05/05/18 2153 05/06/18 0452  BP: 113/61 132/61 132/61 132/60  Pulse: (!) 112 (!) 105 (!) 104 98  Resp: 12  18 18   Temp:  98.8 F (37.1 C) 98.8 F (37.1 C) 98.2 F (36.8 C)  TempSrc:  Oral Oral Oral  SpO2: 97% 100% 100% 98%  Weight:      Height:  5\' 9"  (1.753 m)      Intake/Output Summary (Last 24 hours) at 05/06/2018 0945 Last data filed at 05/06/2018 0740 Gross per 24 hour  Intake 1153.75 ml  Output 80 ml  Net 1073.75 ml   Filed Weights   05/05/18 1202  Weight: 103.9 kg (229 lb)    Examination:  General exam: Appears calm and comfortable  Respiratory system: Clear to auscultation. Respiratory effort normal. Cardiovascular system: S1 & S2 heard, RRR. No JVD, murmurs, rubs, gallops or clicks. No pedal edema. Gastrointestinal system: Abdomen is nondistended, soft and nontender. No organomegaly or masses felt. Normal bowel sounds heard. Central nervous system: Alert and oriented. No focal neurological deficits. Extremities: Symmetric 5 x 5 power. Skin: No rashes, lesions or ulcers Psychiatry: Judgement and insight appear normal. Mood & affect appropriate.     Data Reviewed: I have personally  reviewed following labs and imaging studies  CBC: Recent Labs  Lab 05/05/18 1207 05/05/18 2214 05/06/18 0238 05/06/18 0622  WBC 10.3 11.7* 9.8 9.4  NEUTROABS 8.4*  --   --   --   HGB 9.6* 8.7* 8.4* 8.1*  HCT 29.3* 26.7* 25.1* 24.6*  MCV 99.3 98.2 98.4 100.4*  PLT 378 340 322 202   Basic Metabolic Panel: Recent Labs  Lab 05/05/18 1207 05/06/18 0238  NA 134* 137  K 3.9 4.5  CL 103 107  CO2 23 23  GLUCOSE 121* 101*  BUN 38* 38*  CREATININE  1.93* 1.85*  CALCIUM 8.4* 8.1*   GFR: Estimated Creatinine Clearance: 34.1 mL/min (A) (by C-G formula based on SCr of 1.85 mg/dL (H)). Liver Function Tests: Recent Labs  Lab 05/05/18 1207  AST 17  ALT 15*  ALKPHOS 68  BILITOT 0.8  PROT 5.6*  ALBUMIN 2.7*   No results for input(s): LIPASE, AMYLASE in the last 168 hours. No results for input(s): AMMONIA in the last 168 hours. Coagulation Profile: Recent Labs  Lab 05/06/18 0622  INR 1.33   Cardiac Enzymes: No results for input(s): CKTOTAL, CKMB, CKMBINDEX, TROPONINI in the last 168 hours. BNP (last 3 results) No results for input(s): PROBNP in the last 8760 hours. HbA1C: No results for input(s): HGBA1C in the last 72 hours. CBG: No results for input(s): GLUCAP in the last 168 hours. Lipid Profile: No results for input(s): CHOL, HDL, LDLCALC, TRIG, CHOLHDL, LDLDIRECT in the last 72 hours. Thyroid Function Tests: No results for input(s): TSH, T4TOTAL, FREET4, T3FREE, THYROIDAB in the last 72 hours. Anemia Panel: No results for input(s): VITAMINB12, FOLATE, FERRITIN, TIBC, IRON, RETICCTPCT in the last 72 hours. Sepsis Labs: Recent Labs  Lab 05/05/18 1238  LATICACIDVEN 1.60    Recent Results (from the past 240 hour(s))  C difficile quick scan w PCR reflex     Status: None   Collection Time: 05/06/18  5:06 AM  Result Value Ref Range Status   C Diff antigen NEGATIVE NEGATIVE Final   C Diff toxin NEGATIVE NEGATIVE Final   C Diff interpretation No C. difficile detected.  Final    Comment: Performed at [facility], [place] [address], [place], [place] [address]         Radiology Studies: No results found.      Scheduled Meds: Continuous Infusions: . sodium chloride 75 mL/hr at 05/05/18 2317  . sodium chloride    . cefTRIAXone (ROCEPHIN)  IV    . famotidine (PEPCID) IV Stopped (05/06/18 0026)     LOS: 1 day     Georgette Shell, MD [facility] If 7PM-7AM, please contact  night-coverage [email or web address] Password [email or web address] 05/06/2018, 9:45 AM

## 2018-05-06 NOTE — H&P (View-Only) (Signed)
Referring Provider: Dr. Rodena Piety Primary Care Physician:  Irish Lack., PA-C Primary Gastroenterologist:  Althia Forts  Reason for Consultation:  Melena  HPI: Eric [name] is a 84 y.o. male with multiple medical problems seen for having a large melenic stool today in the hospital. He or his wife have not noticed black stools at home stating they have been loose brown stools. Denies N/V/abdominal pain/hematochezia. Occasional heartburn. Occasional Aleve until recently. No history of ulcers. Has been on Eliquis following recent knee surgery. Hgb 8.2. Heme positive. Wife in room.  Past Medical History:  Diagnosis Date  . BPH (benign prostatic hyperplasia)   . CKD (chronic kidney disease), stage III ([place])   . History of kidney stones   . Hypertension   . Osteoarthritis of right knee   . Spinal stenosis     Past Surgical History:  Procedure Laterality Date  . CARPAL TUNNEL RELEASE Right 09/29/2016   Procedure: CARPAL TUNNEL RELEASE;  Surgeon: Frederik Pear, MD;  Location: [place];  Service: Orthopedics;  Laterality: Right;  . cataract surgery Bilateral ~15 yrs ago  . EYE SURGERY     Bilateral  . HIP ARTHROPLASTY     Left  . JOINT REPLACEMENT     hip  . TONSILLECTOMY    . TOTAL KNEE ARTHROPLASTY Right 04/19/2018   Procedure: TOTAL KNEE ARTHROPLASTY;  Surgeon: Frederik Pear, MD;  Location: [place];  Service: Orthopedics;  Laterality: Right;    Prior to Admission medications   Medication Sig Start Date End Date Taking? Authorizing Provider  acetaminophen (TYLENOL) 500 MG tablet Take 500-1,000 mg by mouth every 6 (six) hours as needed (for pain).   Yes [provider]  apixaban (ELIQUIS) 2.5 MG TABS tablet Take 1 tablet (2.5 mg total) by mouth 2 (two) times daily. 04/19/18  Yes Joanell Rising K, PA-C  lisinopril (PRINIVIL,ZESTRIL) 40 MG tablet Take 40 mg by mouth daily.   Yes [provider]  tiZANidine (ZANAFLEX) 2 MG tablet Take 1 tablet (2 mg total) by  mouth every 6 (six) hours as needed. Patient taking differently: Take 2 mg by mouth every 6 (six) hours as needed for muscle spasms.  04/19/18  Yes Leighton Parody, PA-C  oxyCODONE-acetaminophen (PERCOCET/ROXICET) 5-325 MG tablet Take 1 tablet by mouth every 4 (four) hours as needed for severe pain. Patient not taking: Reported on 05/05/2018 04/19/18   Leighton Parody, PA-C    Scheduled Meds: . Derrill Memo ON 05/09/2018] pantoprazole  40 mg Intravenous Q12H   Continuous Infusions: . sodium chloride 75 mL/hr at 05/05/18 2317  . cefTRIAXone (ROCEPHIN)  IV    . pantoprozole (PROTONIX) infusion 8 mg/hr (05/06/18 1553)   PRN Meds:.acetaminophen, ondansetron **OR** ondansetron (ZOFRAN) IV  Allergies as of 05/05/2018 - Review Complete 05/05/2018  Allergen Reaction Noted  . Nsaids Other (See Comments) 09/22/2016  . Sulfa antibiotics Other (See Comments) 04/19/2018  . Tolmetin Rash and Other (See Comments) 09/22/2016    No family history on file.  Social History   Socioeconomic History  . Marital status: Married    Spouse name: Not on file  . Number of children: Not on file  . Years of education: Not on file  . Highest education level: Not on file  Occupational History  . Not on file  Social Needs  . Financial resource strain: Not on file  . Food insecurity:    Worry: Not on file    Inability: Not on file  . Transportation needs:  Medical: Not on file    Non-medical: Not on file  Tobacco Use  . Smoking status: Former Smoker    Types: Cigarettes  . Smokeless tobacco: Never Used  . Tobacco comment: Quit smoking 50+ years ago  Substance and Sexual Activity  . Alcohol use: No  . Drug use: No  . Sexual activity: Not on file  Lifestyle  . Physical activity:    Days per week: Not on file    Minutes per session: Not on file  . Stress: Not on file  Relationships  . Social connections:    Talks on phone: Not on file    Gets together: Not on file    Attends religious service: Not on  file    Active member of club or organization: Not on file    Attends meetings of clubs or organizations: Not on file    Relationship status: Not on file  . Intimate partner violence:    Fear of current or ex partner: Not on file    Emotionally abused: Not on file    Physically abused: Not on file    Forced sexual activity: Not on file  Other Topics Concern  . Not on file  Social History Narrative  . Not on file    Review of Systems: All negative except as stated above in HPI.  Physical Exam: Vital signs: Vitals:   05/06/18 1306 05/06/18 1551  BP: (!) 117/57 (!) 136/48  Pulse: 82 81  Resp: 16 16  Temp: 98.6 F (37 C) 99.1 F (37.3 C)  SpO2: 100% 99%   Last BM Date: 05/05/18 General:   Lethargic, elderly, well-nourished, pleasant, no acute distress Head: normocephalic, atraumatic Eyes: anicteric sclera ENT: oropharynx clear Neck: supple, nontender Lungs:  Clear throughout to auscultation.   No wheezes, crackles, or rhonchi. No acute distress. Heart:  Regular rate and rhythm; no murmurs, clicks, rubs,  or gallops. Abdomen: soft, nontender, nondistended, +BS  Rectal:  Deferred Ext: no edema  GI:  Lab Results: Recent Labs    05/06/18 0238 05/06/18 0622 05/06/18 1038  WBC 9.8 9.4 8.0  HGB 8.4* 8.1* 8.2*  HCT 25.1* 24.6* 24.6*  PLT 322 308 305   BMET Recent Labs    05/05/18 1207 05/06/18 0238  NA 134* 137  K 3.9 4.5  CL 103 107  CO2 23 23  GLUCOSE 121* 101*  BUN 38* 38*  CREATININE 1.93* 1.85*  CALCIUM 8.4* 8.1*   LFT Recent Labs    05/05/18 1207  PROT 5.6*  ALBUMIN 2.7*  AST 17  ALT 15*  ALKPHOS 68  BILITOT 0.8   PT/INR Recent Labs    05/06/18 0622  LABPROT 16.3*  INR 1.33     Studies/Results: No results found.  Impression/Plan: Melenic stools with anemia on Eliquis. Hemodynamically stable. EGD tomorrow to look for peptic ulcer disease. Continue Protonix drip. Full liquid diet now and NPO after midnight. Supportive care.    LOS:  1 day   [place] C.  05/06/2018, 5:17 PM  Questions please call [phone]

## 2018-05-06 NOTE — Consult Note (Addendum)
Referring Provider: Dr. Rodena Piety Primary Care Physician:  Irish Lack., PA-C Primary Gastroenterologist:  Althia Forts  Reason for Consultation:  Melena  HPI: Eric [name] is a 84 y.o. male with multiple medical problems seen for having a large melenic stool today in the hospital. He or his wife have not noticed black stools at home stating they have been loose brown stools. Denies N/V/abdominal pain/hematochezia. Occasional heartburn. Occasional Aleve until recently. No history of ulcers. Has been on Eliquis following recent knee surgery. Hgb 8.2. Heme positive. Wife in room.  Past Medical History:  Diagnosis Date  . BPH (benign prostatic hyperplasia)   . CKD (chronic kidney disease), stage III ([place])   . History of kidney stones   . Hypertension   . Osteoarthritis of right knee   . Spinal stenosis     Past Surgical History:  Procedure Laterality Date  . CARPAL TUNNEL RELEASE Right 09/29/2016   Procedure: CARPAL TUNNEL RELEASE;  Surgeon: Frederik Pear, MD;  Location: [facility];  Service: Orthopedics;  Laterality: Right;  . cataract surgery Bilateral ~15 yrs ago  . EYE SURGERY     Bilateral  . HIP ARTHROPLASTY     Left  . JOINT REPLACEMENT     hip  . TONSILLECTOMY    . TOTAL KNEE ARTHROPLASTY Right 04/19/2018   Procedure: TOTAL KNEE ARTHROPLASTY;  Surgeon: Frederik Pear, MD;  Location: [place];  Service: Orthopedics;  Laterality: Right;    Prior to Admission medications   Medication Sig Start Date End Date Taking? Authorizing Provider  acetaminophen (TYLENOL) 500 MG tablet Take 500-1,000 mg by mouth every 6 (six) hours as needed (for pain).   Yes [provider]  apixaban (ELIQUIS) 2.5 MG TABS tablet Take 1 tablet (2.5 mg total) by mouth 2 (two) times daily. 04/19/18  Yes Joanell Rising K, PA-C  lisinopril (PRINIVIL,ZESTRIL) 40 MG tablet Take 40 mg by mouth daily.   Yes [provider]  tiZANidine (ZANAFLEX) 2 MG tablet Take 1 tablet (2 mg total) by  mouth every 6 (six) hours as needed. Patient taking differently: Take 2 mg by mouth every 6 (six) hours as needed for muscle spasms.  04/19/18  Yes Leighton Parody, PA-C  oxyCODONE-acetaminophen (PERCOCET/ROXICET) 5-325 MG tablet Take 1 tablet by mouth every 4 (four) hours as needed for severe pain. Patient not taking: Reported on 05/05/2018 04/19/18   Leighton Parody, PA-C    Scheduled Meds: . Derrill Memo ON 05/09/2018] pantoprazole  40 mg Intravenous Q12H   Continuous Infusions: . sodium chloride 75 mL/hr at 05/05/18 2317  . cefTRIAXone (ROCEPHIN)  IV    . pantoprozole (PROTONIX) infusion 8 mg/hr (05/06/18 1553)   PRN Meds:.acetaminophen, ondansetron **OR** ondansetron (ZOFRAN) IV  Allergies as of 05/05/2018 - Review Complete 05/05/2018  Allergen Reaction Noted  . Nsaids Other (See Comments) 09/22/2016  . Sulfa antibiotics Other (See Comments) 04/19/2018  . Tolmetin Rash and Other (See Comments) 09/22/2016    No family history on file.  Social History   Socioeconomic History  . Marital status: Married    Spouse name: Not on file  . Number of children: Not on file  . Years of education: Not on file  . Highest education level: Not on file  Occupational History  . Not on file  Social Needs  . Financial resource strain: Not on file  . Food insecurity:    Worry: Not on file    Inability: Not on file  . Transportation needs:  Medical: Not on file    Non-medical: Not on file  Tobacco Use  . Smoking status: Former Smoker    Types: Cigarettes  . Smokeless tobacco: Never Used  . Tobacco comment: Quit smoking 50+ years ago  Substance and Sexual Activity  . Alcohol use: No  . Drug use: No  . Sexual activity: Not on file  Lifestyle  . Physical activity:    Days per week: Not on file    Minutes per session: Not on file  . Stress: Not on file  Relationships  . Social connections:    Talks on phone: Not on file    Gets together: Not on file    Attends religious service: Not on  file    Active member of club or organization: Not on file    Attends meetings of clubs or organizations: Not on file    Relationship status: Not on file  . Intimate partner violence:    Fear of current or ex partner: Not on file    Emotionally abused: Not on file    Physically abused: Not on file    Forced sexual activity: Not on file  Other Topics Concern  . Not on file  Social History Narrative  . Not on file    Review of Systems: All negative except as stated above in HPI.  Physical Exam: Vital signs: Vitals:   05/06/18 1306 05/06/18 1551  BP: (!) 117/57 (!) 136/48  Pulse: 82 81  Resp: 16 16  Temp: 98.6 F (37 C) 99.1 F (37.3 C)  SpO2: 100% 99%   Last BM Date: 05/05/18 General:   Lethargic, elderly, well-nourished, pleasant, no acute distress Head: normocephalic, atraumatic Eyes: anicteric sclera ENT: oropharynx clear Neck: supple, nontender Lungs:  Clear throughout to auscultation.   No wheezes, crackles, or rhonchi. No acute distress. Heart:  Regular rate and rhythm; no murmurs, clicks, rubs,  or gallops. Abdomen: soft, nontender, nondistended, +BS  Rectal:  Deferred Ext: no edema  GI:  Lab Results: Recent Labs    05/06/18 0238 05/06/18 0622 05/06/18 1038  WBC 9.8 9.4 8.0  HGB 8.4* 8.1* 8.2*  HCT 25.1* 24.6* 24.6*  PLT 322 308 305   BMET Recent Labs    05/05/18 1207 05/06/18 0238  NA 134* 137  K 3.9 4.5  CL 103 107  CO2 23 23  GLUCOSE 121* 101*  BUN 38* 38*  CREATININE 1.93* 1.85*  CALCIUM 8.4* 8.1*   LFT Recent Labs    05/05/18 1207  PROT 5.6*  ALBUMIN 2.7*  AST 17  ALT 15*  ALKPHOS 68  BILITOT 0.8   PT/INR Recent Labs    05/06/18 0622  LABPROT 16.3*  INR 1.33     Studies/Results: No results found.  Impression/Plan: Melenic stools with anemia on Eliquis. Hemodynamically stable. EGD tomorrow to look for peptic ulcer disease. Continue Protonix drip. Full liquid diet now and NPO after midnight. Supportive care.    LOS:  1 day   [place] C.  05/06/2018, 5:17 PM  Questions please call [phone]

## 2018-05-06 NOTE — Progress Notes (Signed)
   05/06/18 1551  Vitals  Temp 99.1 F (37.3 C)  Temp Source Oral  Pulse Rate 81  Resp 16  BP (!) 136/48  Oxygen Therapy  SpO2 99 %  O2 Device Room Air  1 unit prbcs transfused as ordered with no s/s of reaction noted

## 2018-05-07 ENCOUNTER — Encounter (HOSPITAL_COMMUNITY)
Admission: EM | Disposition: A | Discharge status: Home / Self Care | Payer: Self-pay | Source: Home / Self Care | Attending: Internal Medicine

## 2018-05-07 ENCOUNTER — Inpatient Hospital Stay (HOSPITAL_COMMUNITY): Payer: Medicare Other | Admitting: Anesthesiology

## 2018-05-07 ENCOUNTER — Encounter (HOSPITAL_COMMUNITY): Payer: Self-pay | Admitting: *Deleted

## 2018-05-07 HISTORY — PX: ESOPHAGOGASTRODUODENOSCOPY (EGD) WITH PROPOFOL: SHX5813

## 2018-05-07 LAB — TYPE AND SCREEN
ABO/RH(D): A POS
ANTIBODY SCREEN: NEGATIVE
Unit division: 0

## 2018-05-07 LAB — URINE CULTURE

## 2018-05-07 LAB — CBC
HCT: 27.9 % — ABNORMAL LOW (ref 39.0–52.0)
Hemoglobin: 9.2 g/dL — ABNORMAL LOW (ref 13.0–17.0)
MCH: 32.5 pg (ref 26.0–34.0)
MCHC: 33 g/dL (ref 30.0–36.0)
MCV: 98.6 fL (ref 78.0–100.0)
PLATELETS: 307 10*3/uL (ref 150–400)
RBC: 2.83 MIL/uL — ABNORMAL LOW (ref 4.22–5.81)
RDW: 13.4 % (ref 11.5–15.5)
WBC: 6.7 10*3/uL (ref 4.0–10.5)

## 2018-05-07 LAB — BPAM RBC
Blood Product Expiration Date: 201906102359
ISSUE DATE / TIME: 201905251243
UNIT TYPE AND RH: 6200

## 2018-05-07 LAB — OCCULT BLOOD X 1 CARD TO LAB, STOOL: Fecal Occult Bld: POSITIVE — AB

## 2018-05-07 SURGERY — ESOPHAGOGASTRODUODENOSCOPY (EGD) WITH PROPOFOL
Anesthesia: Monitor Anesthesia Care

## 2018-05-07 MED ORDER — PROPOFOL 500 MG/50ML IV EMUL
INTRAVENOUS | Status: DC | PRN
  Administered 2018-05-07: 75 ug/kg/min via INTRAVENOUS

## 2018-05-07 MED ORDER — PROPOFOL 10 MG/ML IV BOLUS
INTRAVENOUS | Status: DC | PRN
  Administered 2018-05-07: 70 mg via INTRAVENOUS
  Administered 2018-05-07: 30 mg via INTRAVENOUS

## 2018-05-07 MED ORDER — LIDOCAINE HCL (CARDIAC) PF 100 MG/5ML IV SOSY
PREFILLED_SYRINGE | INTRAVENOUS | Status: DC | PRN
  Administered 2018-05-07: 100 mg via INTRAVENOUS

## 2018-05-07 SURGICAL SUPPLY — 15 items

## 2018-05-07 NOTE — Progress Notes (Signed)
Pt had been NPO since midnight. Consent for EGD already signed.

## 2018-05-07 NOTE — Anesthesia Procedure Notes (Signed)
Procedure Name: MAC Date/Time: 05/07/2018 10:05 AM Performed by: Izora Gala, CRNA Pre-anesthesia Checklist: Patient identified, Emergency Drugs available and Suction available Patient Re-evaluated:Patient Re-evaluated prior to induction Oxygen Delivery Method: Nasal cannula Preoxygenation: Pre-oxygenation with 100% oxygen Induction Type: IV induction Placement Confirmation: positive ETCO2

## 2018-05-07 NOTE — Progress Notes (Signed)
PROGRESS NOTE    Eric [name]  TZG:[id number] DOB: [birth year] DOA: 05/05/2018 PCP: Irish Lack., PA-C    Brief Narrative:84 y.o.malewith a known history of BPH, CKD3, HTN, recent right TKR 5/8/19presents to the emergency department for evaluation ofanemia. Patient was referred to the ED by his PMD for anemia and AKI. Patient has been on Eliquis following TKR on 04/19/18 and reports "sticky" diarrhea since that hospitalization. Diarrhea is profuse, foul-smelling and watery and is associated with incontinence. Denies recent antibiotic use. He did have some persistent postop leg swelling and pain for which a doppler ruled out DVT this past week. He has been ambulatory.   Of note he has had chronic urinary tract problems including recurrent UTIs and BPH.Otherwise there has been no change in status. Patient has been taking medication as prescribed and there has been no recent change in medication or diet.   Patient denies fevers/chills, weakness, dizziness, chest pain, shortness of breath, abdominal pain, dysuria/frequency, changes in mental status.     Assessment & Plan:   Active Problems:   GI bleed   AKI (acute kidney injury) ([place])  1] upper GI bleed patient recently started on Eliquis after knee surgery.  Eliquis is being on hold.    Continue IV Protonix drip.  His hemoglobin on admission was 9.6 and has come down to 8.1.  He received 1 unit of blood transfusion hemoglobin up to 9.2 today.  He is scheduled to have EGD today.  2] AKI/CKD stage III continue hydration follow-up labs creatinine improving.  3] history of hypertension lisinopril is on hold blood pressure stable.  4] history of recent right total knee replacement 04/19/2018  5] history of BPH and recurrent UTI his UA appears dirty but urine culture is pending.        DVT prophylaxis:scd Code Status:full Family Communication:dw wife Disposition Plan:tbd Consultants:  gi   Procedures:none Antimicrobials:none Subjective:no further bms.feels better   Objective: Vitals:   05/06/18 2059 05/07/18 0630 05/07/18 0632 05/07/18 0922  BP: (!) 113/48 110/69 126/60 139/64  Pulse: (!) 111 98 80   Resp: 18 17 17 16   Temp: 98.9 F (37.2 C) 98.6 F (37 C) (!) 97.5 F (36.4 C) 98.9 F (37.2 C)  TempSrc:  Oral Oral Oral  SpO2: 98% 97% 100% 100%  Weight:    103.9 kg (229 lb)  Height:    5\' 9"  (1.753 m)    Intake/Output Summary (Last 24 hours) at 05/07/2018 1001 Last data filed at 05/07/2018 0830 Gross per 24 hour  Intake 1881.25 ml  Output 1170 ml  Net 711.25 ml   Filed Weights   05/05/18 1202 05/07/18 0922  Weight: 103.9 kg (229 lb) 103.9 kg (229 lb)    Examination:  General exam: Appears calm and comfortable  Respiratory system: Clear to auscultation. Respiratory effort normal. Cardiovascular system: S1 & S2 heard, RRR. No JVD, murmurs, rubs, gallops or clicks. No pedal edema. Gastrointestinal system: Abdomen is nondistended, soft and nontender. No organomegaly or masses felt. Normal bowel sounds heard. Central nervous system: Alert and oriented. No focal neurological deficits. Extremities: Symmetric 5 x 5 power. Skin: No rashes, lesions or ulcers Psychiatry: Judgement and insight appear normal. Mood & affect appropriate.     Data Reviewed: I have personally reviewed following labs and imaging studies  CBC: Recent Labs  Lab 05/05/18 1207 05/05/18 2214 05/06/18 0238 05/06/18 0622 05/06/18 1038 05/07/18 0646  WBC 10.3 11.7* 9.8 9.4 8.0 6.7  NEUTROABS 8.4*  --   --   --   --   --  HGB 9.6* 8.7* 8.4* 8.1* 8.2* 9.2*  HCT 29.3* 26.7* 25.1* 24.6* 24.6* 27.9*  MCV 99.3 98.2 98.4 100.4* 99.2 98.6  PLT 378 340 322 308 305 683   Basic Metabolic Panel: Recent Labs  Lab 05/05/18 1207 05/06/18 0238  NA 134* 137  K 3.9 4.5  CL 103 107  CO2 23 23  GLUCOSE 121* 101*  BUN 38* 38*  CREATININE 1.93* 1.85*  CALCIUM 8.4* 8.1*    GFR: Estimated Creatinine Clearance: 34.1 mL/min (A) (by C-G formula based on SCr of 1.85 mg/dL (H)). Liver Function Tests: Recent Labs  Lab 05/05/18 1207  AST 17  ALT 15*  ALKPHOS 68  BILITOT 0.8  PROT 5.6*  ALBUMIN 2.7*   No results for input(s): LIPASE, AMYLASE in the last 168 hours. No results for input(s): AMMONIA in the last 168 hours. Coagulation Profile: Recent Labs  Lab 05/06/18 0622  INR 1.33   Cardiac Enzymes: No results for input(s): CKTOTAL, CKMB, CKMBINDEX, TROPONINI in the last 168 hours. BNP (last 3 results) No results for input(s): PROBNP in the last 8760 hours. HbA1C: No results for input(s): HGBA1C in the last 72 hours. CBG: No results for input(s): GLUCAP in the last 168 hours. Lipid Profile: No results for input(s): CHOL, HDL, LDLCALC, TRIG, CHOLHDL, LDLDIRECT in the last 72 hours. Thyroid Function Tests: No results for input(s): TSH, T4TOTAL, FREET4, T3FREE, THYROIDAB in the last 72 hours. Anemia Panel: No results for input(s): VITAMINB12, FOLATE, FERRITIN, TIBC, IRON, RETICCTPCT in the last 72 hours. Sepsis Labs: Recent Labs  Lab 05/05/18 1238  LATICACIDVEN 1.60    Recent Results (from the past 240 hour(s))  Gastrointestinal Panel by PCR , Stool     Status: None   Collection Time: 05/05/18  5:36 PM  Result Value Ref Range Status   Campylobacter species NOT DETECTED NOT DETECTED Final   Plesimonas shigelloides NOT DETECTED NOT DETECTED Final   Salmonella species NOT DETECTED NOT DETECTED Final   Yersinia enterocolitica NOT DETECTED NOT DETECTED Final   Vibrio species NOT DETECTED NOT DETECTED Final   Vibrio cholerae NOT DETECTED NOT DETECTED Final   Enteroaggregative E coli (EAEC) NOT DETECTED NOT DETECTED Final   Enteropathogenic E coli (EPEC) NOT DETECTED NOT DETECTED Final   Enterotoxigenic E coli (ETEC) NOT DETECTED NOT DETECTED Final   Shiga like toxin producing E coli (STEC) NOT DETECTED NOT DETECTED Final    Shigella/Enteroinvasive E coli (EIEC) NOT DETECTED NOT DETECTED Final   Cryptosporidium NOT DETECTED NOT DETECTED Final   Cyclospora cayetanensis NOT DETECTED NOT DETECTED Final   Entamoeba histolytica NOT DETECTED NOT DETECTED Final   Giardia lamblia NOT DETECTED NOT DETECTED Final   Adenovirus F40/41 NOT DETECTED NOT DETECTED Final   Astrovirus NOT DETECTED NOT DETECTED Final   Norovirus GI/GII NOT DETECTED NOT DETECTED Final   Rotavirus A NOT DETECTED NOT DETECTED Final   Sapovirus (I, II, IV, and V) NOT DETECTED NOT DETECTED Final    Comment: Performed at [facility], [address], [place], [place] [address]  Urine Culture     Status: Abnormal   Collection Time: 05/05/18  7:42 PM  Result Value Ref Range Status   Specimen Description URINE, CLEAN CATCH  Final   Special Requests   Final    NONE Performed at [facility], [address] [place], [place] [address]    Culture >=100,000 COLONIES/mL ESCHERICHIA COLI (A)  Final   Report Status 05/07/2018 FINAL  Final   Organism ID,  Bacteria ESCHERICHIA COLI (A)  Final      Susceptibility   Escherichia coli - MIC*    AMPICILLIN >=32 RESISTANT Resistant     CEFAZOLIN <=4 SENSITIVE Sensitive     CEFTRIAXONE <=1 SENSITIVE Sensitive     CIPROFLOXACIN <=0.25 SENSITIVE Sensitive     GENTAMICIN <=1 SENSITIVE Sensitive     IMIPENEM <=0.25 SENSITIVE Sensitive     NITROFURANTOIN <=16 SENSITIVE Sensitive     TRIMETH/SULFA <=20 SENSITIVE Sensitive     AMPICILLIN/SULBACTAM 16 INTERMEDIATE Intermediate     PIP/TAZO <=4 SENSITIVE Sensitive     Extended ESBL NEGATIVE Sensitive     * >=100,000 COLONIES/mL ESCHERICHIA COLI  C difficile quick scan w PCR reflex     Status: None   Collection Time: 05/06/18  5:06 AM  Result Value Ref Range Status   C Diff antigen NEGATIVE NEGATIVE Final   C Diff toxin NEGATIVE NEGATIVE Final   C Diff interpretation No C. difficile detected.  Final    Comment: Performed at [facility],  [place] [address] [place], [place] [address]         Radiology Studies: No results found.      Scheduled Meds: . [MAR Hold] pantoprazole  40 mg Intravenous Q12H   Continuous Infusions: . sodium chloride 75 mL/hr at 05/07/18 0639  . pantoprozole (PROTONIX) infusion 8 mg/hr (05/06/18 1553)     LOS: 2 days     Georgette Shell, MD [facility]  If 7PM-7AM, please contact night-coverage [email or web address] Password [email or web address] 05/07/2018, 10:01 AM

## 2018-05-07 NOTE — Anesthesia Postprocedure Evaluation (Signed)
Anesthesia Post Note  Patient: Eric [name]  Procedure(s) Performed: ESOPHAGOGASTRODUODENOSCOPY (EGD) WITH PROPOFOL (N/A )     Patient location during evaluation: PACU Anesthesia Type: MAC Level of consciousness: awake and alert Pain management: pain level controlled Vital Signs Assessment: post-procedure vital signs reviewed and stable Respiratory status: spontaneous breathing, nonlabored ventilation, respiratory function stable and patient connected to nasal cannula oxygen Cardiovascular status: stable and blood pressure returned to baseline Postop Assessment: no apparent nausea or vomiting Anesthetic complications: no    Last Vitals:  Vitals:   05/07/18 1031 05/07/18 1050  BP: (!) 128/57 (!) 118/56  Pulse: 86 85  Resp: 16 16  Temp:    SpO2: 100% 100%    Last Pain:  Vitals:   05/07/18 1031  TempSrc:   PainSc: 0-No pain                 Nyiesha Beever DAVID

## 2018-05-07 NOTE — Transfer of Care (Signed)
Immediate Anesthesia Transfer of Care Note  Patient: Eric [name]  Procedure(s) Performed: ESOPHAGOGASTRODUODENOSCOPY (EGD) WITH PROPOFOL (N/A )  Patient Location: PACU  Anesthesia Type:MAC  Level of Consciousness: awake and sedated  Airway & Oxygen Therapy: Patient Spontanous Breathing and Patient connected to nasal cannula oxygen  Post-op Assessment: Report given to RN and Post -op Vital signs reviewed and stable  Post vital signs: Reviewed and stable  Last Vitals:  Vitals Value Taken Time  BP    Temp    Pulse 95 05/07/2018 10:18 AM  Resp 26 05/07/2018 10:18 AM  SpO2 97 % 05/07/2018 10:18 AM  Vitals shown include unvalidated device data.  Last Pain:  Vitals:   05/07/18 0922  TempSrc: Oral  PainSc: 0-No pain         Complications: No apparent anesthesia complications

## 2018-05-07 NOTE — Op Note (Signed)
[facility] Patient Name: Eric [name] Procedure Date : 05/07/2018 MRN: [id number] Attending MD: Lear Ng , MD Date of Birth: 11-03-1929 CSN: [id number] Age: 84 Admit Type: Inpatient Procedure:                Upper GI endoscopy Indications:              Suspected upper gastrointestinal bleeding, Melena Providers:                Lear Ng, MD, Zenon Mayo, RN,                            Amario Dalton, Technician Referring MD:             hospital team Medicines:                Propofol per Anesthesia, Monitored Anesthesia Care Complications:            No immediate complications. Estimated Blood Loss:     Estimated blood loss: none. Procedure:                Pre-Anesthesia Assessment:                           - Prior to the procedure, a History and Physical                            was performed, and patient medications and                            allergies were reviewed. The patient's tolerance of                            previous anesthesia was also reviewed. The risks                            and benefits of the procedure and the sedation                            options and risks were discussed with the patient.                            All questions were answered, and informed consent                            was obtained. Prior Anticoagulants: The patient has                            taken no previous anticoagulant or antiplatelet                            agents. ASA Grade Assessment: III - A patient with                            severe systemic disease. After reviewing the risks  and benefits, the patient was deemed in                            satisfactory condition to undergo the procedure.                           After obtaining informed consent, the endoscope was                            passed under direct vision. Throughout the                            procedure, the patient's  blood pressure, pulse, and                            oxygen saturations were monitored continuously. The                            [id number] ([id number]) scope was introduced through the                            mouth, and advanced to the second part of duodenum.                            The upper GI endoscopy was accomplished without                            difficulty. The patient tolerated the procedure                            well. Scope In: Scope Out: Findings:      LA Grade A (one or more mucosal breaks less than 5 mm, not extending       between tops of 2 mucosal folds) esophagitis with no bleeding was found       at the gastroesophageal junction.      A low-grade of narrowing Schatzki ring was found at the gastroesophageal       junction.      The Z-line was found 40 cm from the incisors.      The entire examined stomach was normal.      The cardia and gastric fundus were normal on retroflexion.      A single small mucosal nodule with a localized distribution was found in       the duodenal bulb.      The exam of the duodenum was otherwise normal. Impression:               - LA Grade A reflux esophagitis.                           - Low-grade of narrowing Schatzki ring.                           - Z-line, 40 cm from the incisors.                           -  Normal stomach.                           - Mucosal nodule found in the duodenum.                           - No specimens collected. Recommendation:           - Advance diet as tolerated and soft diet.                           - Observe patient's clinical course. Procedure Code(s):        --- Professional ---                           [phone], Esophagogastroduodenoscopy, flexible,                            transoral; diagnostic, including collection of                            specimen(s) by brushing or washing, when performed                            (separate procedure) Diagnosis Code(s):        ---  Professional ---                           K92.1, Melena (includes Hematochezia)                           K21.0, Gastro-esophageal reflux disease with                            esophagitis                           K22.2, Esophageal obstruction                           K31.89, Other diseases of stomach and duodenum CPT copyright 2017 American Medical Association. All rights reserved. The codes documented in this report are preliminary and upon coder review may  be revised to meet current compliance requirements. Lear Ng, MD 05/07/2018 10:25:51 AM This report has been signed electronically. Number of Addenda: 0

## 2018-05-07 NOTE — Interval H&P Note (Signed)
History and Physical Interval Note:  05/07/2018 9:51 AM  Eric [name]  has presented today for surgery, with the diagnosis of gi bleed  The various methods of treatment have been discussed with the patient and family. After consideration of risks, benefits and other options for treatment, the patient has consented to  Procedure(s): ESOPHAGOGASTRODUODENOSCOPY (EGD) WITH PROPOFOL (N/A) as a surgical intervention .  The patient's history has been reviewed, patient examined, no change in status, stable for surgery.  I have reviewed the patient's chart and labs.  Questions were answered to the patient's satisfaction.     [place] C.

## 2018-05-07 NOTE — Brief Op Note (Signed)
No source of GI bleeding seen on EGD. See endopro for details. Will advance diet and hold off on further GI endoscopic procedures unless bleeding recurs. Ok to resume anticoagulation when deemed necessary by primary team. Will sign off. Call if questions.

## 2018-05-07 NOTE — Anesthesia Preprocedure Evaluation (Signed)
Anesthesia Evaluation  Patient identified by MRN, date of birth, ID band Patient awake    Reviewed: Allergy & Precautions, NPO status , Patient's Chart, lab work & pertinent test results  Airway Mallampati: I  TM Distance: >3 FB Neck ROM: Full    Dental   Pulmonary former smoker,    Pulmonary exam normal        Cardiovascular hypertension, Pt. on medications Normal cardiovascular exam     Neuro/Psych    GI/Hepatic   Endo/Other    Renal/GU Renal InsufficiencyRenal disease     Musculoskeletal   Abdominal   Peds  Hematology   Anesthesia Other Findings   Reproductive/Obstetrics                             Anesthesia Physical Anesthesia Plan  ASA: III  Anesthesia Plan: MAC   Post-op Pain Management:    Induction: Intravenous  PONV Risk Score and Plan: 1  Airway Management Planned: Simple Face Mask  Additional Equipment:   Intra-op Plan:   Post-operative Plan:   Informed Consent: I have reviewed the patients History and Physical, chart, labs and discussed the procedure including the risks, benefits and alternatives for the proposed anesthesia with the patient or authorized representative who has indicated his/her understanding and acceptance.     Plan Discussed with: CRNA and Surgeon  Anesthesia Plan Comments:         Anesthesia Quick Evaluation

## 2018-05-08 LAB — BASIC METABOLIC PANEL
ANION GAP: 8 (ref 5–15)
BUN: 30 mg/dL — ABNORMAL HIGH (ref 6–20)
CALCIUM: 8.1 mg/dL — AB (ref 8.9–10.3)
CO2: 21 mmol/L — ABNORMAL LOW (ref 22–32)
CREATININE: 1.5 mg/dL — AB (ref 0.61–1.24)
Chloride: 111 mmol/L (ref 101–111)
GFR calc non Af Amer: 40 mL/min — ABNORMAL LOW (ref >60)
GFR, EST AFRICAN AMERICAN: 47 mL/min — AB (ref >60)
Glucose, Bld: 101 mg/dL — ABNORMAL HIGH (ref 65–99)
Potassium: 4.4 mmol/L (ref 3.5–5.1)
SODIUM: 140 mmol/L (ref 135–145)

## 2018-05-08 LAB — CBC WITH DIFFERENTIAL/PLATELET
Abs Immature Granulocytes: 0.1 10*3/uL (ref 0.0–0.1)
BASOS PCT: 1 %
Basophils Absolute: 0.1 10*3/uL (ref 0.0–0.1)
EOS ABS: 0.6 10*3/uL (ref 0.0–0.7)
Eosinophils Relative: 7 %
HEMATOCRIT: 28.8 % — AB (ref 39.0–52.0)
Hemoglobin: 9.5 g/dL — ABNORMAL LOW (ref 13.0–17.0)
IMMATURE GRANULOCYTES: 1 %
LYMPHS ABS: 1.1 10*3/uL (ref 0.7–4.0)
Lymphocytes Relative: 13 %
MCH: 32.5 pg (ref 26.0–34.0)
MCHC: 33 g/dL (ref 30.0–36.0)
MCV: 98.6 fL (ref 78.0–100.0)
Monocytes Absolute: 0.8 10*3/uL (ref 0.1–1.0)
Monocytes Relative: 9 %
NEUTROS PCT: 69 %
Neutro Abs: 5.9 10*3/uL (ref 1.7–7.7)
Platelets: 314 10*3/uL (ref 150–400)
RBC: 2.92 MIL/uL — ABNORMAL LOW (ref 4.22–5.81)
RDW: 13.3 % (ref 11.5–15.5)
WBC: 8.5 10*3/uL (ref 4.0–10.5)

## 2018-05-08 MED ORDER — PANTOPRAZOLE SODIUM 40 MG PO TBEC: 40.0000 mg | DELAYED_RELEASE_TABLET | Freq: Every day | ORAL | 1 refills | Status: DC

## 2018-05-08 MED ORDER — SACCHAROMYCES BOULARDII 250 MG PO CAPS: 250.0000 mg | ORAL_CAPSULE | Freq: Two times a day (BID) | ORAL | 0 refills | Status: DC

## 2018-05-08 MED ORDER — CHOLESTYRAMINE 4 G PO PACK: 4.0000 g | PACK | Freq: Three times a day (TID) | ORAL | 12 refills | Status: DC

## 2018-05-08 MED ORDER — CHOLESTYRAMINE 4 G PO PACK
4.0000 g | PACK | Freq: Three times a day (TID) | ORAL | Status: DC
  Administered 2018-05-08: 4 g via ORAL
  Filled 2018-05-08 (×2): qty 1

## 2018-05-08 MED ORDER — CIPROFLOXACIN HCL 500 MG PO TABS
500.0000 mg | ORAL_TABLET | Freq: Once | ORAL | Status: AC
  Administered 2018-05-08: 500 mg via ORAL
  Filled 2018-05-08: qty 1

## 2018-05-08 MED ORDER — SACCHAROMYCES BOULARDII 250 MG PO CAPS
250.0000 mg | ORAL_CAPSULE | Freq: Two times a day (BID) | ORAL | Status: DC
  Administered 2018-05-08: 250 mg via ORAL
  Filled 2018-05-08 (×2): qty 1

## 2018-05-08 MED ORDER — CIPROFLOXACIN HCL 500 MG PO TABS: 500.0000 mg | ORAL_TABLET | Freq: Two times a day (BID) | ORAL | 0 refills | Status: AC

## 2018-05-08 NOTE — Progress Notes (Deleted)
Pt had severe colicky pain to lower abdomen. Pain med given. Encouraged pt to ambulate today.

## 2018-05-08 NOTE — Discharge Summary (Signed)
Physician Discharge Summary  Eric [name] IFO:[id number] DOB: [birth year] DOA: 05/05/2018  PCP: Irish Lack., PA-C  Admit date: 05/05/2018 Discharge date: 05/08/2018  Admitted From:home Disposition: home Recommendations for Outpatient Follow-up:  1. Follow up with PCP in 1-2 weeks 2. Please obtain BMP/CBC in one week 3. Follow-up with Dr. Mayer Camel in 2 to 4 weeks  [place] none Equipment/Devices none Discharge Condition:stable CODE STATUS full Diet recommendation Brief/Interim Summary: 84 y.o.malewith a known history of BPH, CKD3, HTN, recent right TKR 5/8/19presents to the emergency department for evaluation ofanemia. Patient was referred to the ED by his PMD for anemia and AKI. Patient has been on Eliquis following TKR on 04/19/18 and reports "sticky" diarrhea since that hospitalization. Diarrhea is profuse, foul-smelling and watery and is associated with incontinence. Denies recent antibiotic use. He did have some persistent postop leg swelling and pain for which a doppler ruled out DVT this past week. He has been ambulatory.   Of note he has had chronic urinary tract problems including recurrent UTIs and BPH.Otherwise there has been no change in status. Patient has been taking medication as prescribed and there has been no recent change in medication or diet.   Patient denies fevers/chills, weakness, dizziness, chest pain, shortness of breath, abdominal pain, dysuria/frequency, changes in mental status.    Discharge Diagnoses:  Active Problems:   GI bleed   AKI (acute kidney injury) ([place]) 1]  GI bleed -patient was admitted with loose dark bowel movements multiple episodes prior to admission.  He was found to have a hemoglobin of 8.1 which is come down from 9.6 upon admission.  He got  1 unit of blood transfusion during this hospitalization.  He also was treated with Protonix IV.  EGD was done by Dr. Michail Sermon 05/07/2018 there was no evidence of any active bleeding by  EGD.  Patient remained stable hemoglobin remained stable.  He did not have any further dark/black bowel movements.  He will be discharged home today.  Eliquis will be stopped.  Discussed with the PA on-call for Dr. Brigid Re.   2] AKI on CKD stage III improved with hydration.-Lisinopril was on hold during this hospital stay.   lisinopril will be stopped upon discharge as his blood pressure is soft to normal.  May consider restarting as an outpatient.  3] right total knee replacement 04/19/2018 follow-up with Ortho.  4] history of BPH and recurrent UTI urine culture positive for E. coli sensitive to ciprofloxacin.  Will discharge patient on Cipro.  Discharge Instructions   Allergies as of 05/08/2018      Reactions   Nsaids Other (See Comments)   Renal insufficiency   Sulfa Antibiotics Other (See Comments)   Renal insufficiency at age 84   Tolmetin Rash, Other (See Comments)   Renal insufficiency      Medication List    STOP taking these medications   apixaban 2.5 MG Tabs tablet Commonly known as:  ELIQUIS   lisinopril 40 MG tablet Commonly known as:  PRINIVIL,ZESTRIL   oxyCODONE-acetaminophen 5-325 MG tablet Commonly known as:  PERCOCET/ROXICET     TAKE these medications   acetaminophen 500 MG tablet Commonly known as:  TYLENOL Take 500-1,000 mg by mouth every 6 (six) hours as needed (for pain).   cholestyramine 4 g packet Commonly known as:  QUESTRAN Take 1 packet (4 g total) by mouth 3 (three) times daily.   pantoprazole 40 MG tablet Commonly known as:  PROTONIX Take 1 tablet (40 mg total) by mouth  daily.   saccharomyces boulardii 250 MG capsule Commonly known as:  FLORASTOR Take 1 capsule (250 mg total) by mouth 2 (two) times daily.   tiZANidine 2 MG tablet Commonly known as:  ZANAFLEX Take 1 tablet (2 mg total) by mouth every 6 (six) hours as needed. What changed:  reasons to take this      Follow-up Information    Craft, Junious Dresser., PA-C Follow up.    Specialty:  Internal Medicine Contact information: [address] [place] [address] [phone]        Frederik Pear, MD Follow up.   Specialty:  Orthopedic Surgery Contact information: [place] [address] [phone]          Allergies  Allergen Reactions  . Nsaids Other (See Comments)    Renal insufficiency   . Sulfa Antibiotics Other (See Comments)    Renal insufficiency at age 24  . Tolmetin Rash and Other (See Comments)    Renal insufficiency    Consultations: Dr Michail Sermon  Procedures/Studies: US Venous Img Lower Unilateral Right  Result Date: 05/02/2018 CLINICAL DATA:  Right lower extremity pain and edema. Recent right knee surgery 2 weeks ago. EXAM: RIGHT LOWER EXTREMITY VENOUS DOPPLER ULTRASOUND TECHNIQUE: Gray-scale sonography with graded compression, as well as color Doppler and duplex ultrasound were performed to evaluate the lower extremity deep venous systems from the level of the common femoral vein and including the common femoral, femoral, profunda femoral, popliteal and calf veins including the posterior tibial, peroneal and gastrocnemius veins when visible. The superficial great saphenous vein was also interrogated. Spectral Doppler was utilized to evaluate flow at rest and with distal augmentation maneuvers in the common femoral, femoral and popliteal veins. COMPARISON:  None. FINDINGS: Contralateral Common Femoral Vein: Respiratory phasicity is normal and symmetric with the symptomatic side. No evidence of thrombus. Normal compressibility. Common Femoral Vein: No evidence of thrombus. Normal compressibility, respiratory phasicity and response to augmentation. Saphenofemoral Junction: No evidence of thrombus. Normal compressibility and flow on color Doppler imaging. Profunda Femoral Vein: No evidence of thrombus. Normal compressibility and flow on color Doppler imaging. Femoral Vein: No evidence of thrombus. Normal  compressibility, respiratory phasicity and response to augmentation. Popliteal Vein: No evidence of thrombus. Normal compressibility, respiratory phasicity and response to augmentation. Calf Veins: No evidence of thrombus. Normal compressibility and flow on color Doppler imaging. Superficial Great Saphenous Vein: No evidence of thrombus. Normal compressibility. Venous Reflux:  None. Other Findings: No evidence of superficial thrombophlebitis. Focal fluid collection in the medial right proximal calf measures approximately 4 x 1.3 x 4 cm. This could be a postoperative fluid collection versus dissecting Baker's cyst in the proximal calf. IMPRESSION: 1. No evidence of right lower extremity deep vein thrombosis. 2. 4 cm focal fluid collection in the medial posterior proximal right calf. This could represent a postoperative collection related to the recent knee surgery or dissecting Baker's cyst in the proximal calf. Electronically Signed   By: Aletta Edouard M.D.   On: 05/02/2018 14:48    (Echo, Carotid, EGD, Colonoscopy, ERCP)    Subjective:   Discharge Exam: Vitals:   05/07/18 2053 05/08/18 0609  BP: 127/62 126/61  Pulse: 99 81  Resp: 19 17  Temp: 98.9 F (37.2 C) 98 F (36.7 C)  SpO2: 98% 98%   Vitals:   05/07/18 1050 05/07/18 1423 05/07/18 2053 05/08/18 0609  BP: (!) 118/56 117/61 127/62 126/61  Pulse: 85 93 99 81  Resp: 16 18 19  17  Temp:  98.1 F (36.7 C) 98.9 F (37.2 C) 98 F (36.7 C)  TempSrc:  Oral Oral Oral  SpO2: 100% 99% 98% 98%  Weight:      Height:        General: Pt is alert, awake, not in acute distress Cardiovascular: RRR, S1/S2 +, no rubs, no gallops Respiratory: CTA bilaterally, no wheezing, no rhonchi Abdominal: Soft, NT, ND, bowel sounds + Extremities: no edema, no cyanosis    The results of significant diagnostics from this hospitalization (including imaging, microbiology, ancillary and laboratory) are listed below for reference.      Microbiology: Recent Results (from the past 240 hour(s))  Gastrointestinal Panel by PCR , Stool     Status: None   Collection Time: 05/05/18  5:36 PM  Result Value Ref Range Status   Campylobacter species NOT DETECTED NOT DETECTED Final   Plesimonas shigelloides NOT DETECTED NOT DETECTED Final   Salmonella species NOT DETECTED NOT DETECTED Final   Yersinia enterocolitica NOT DETECTED NOT DETECTED Final   Vibrio species NOT DETECTED NOT DETECTED Final   Vibrio cholerae NOT DETECTED NOT DETECTED Final   Enteroaggregative E coli (EAEC) NOT DETECTED NOT DETECTED Final   Enteropathogenic E coli (EPEC) NOT DETECTED NOT DETECTED Final   Enterotoxigenic E coli (ETEC) NOT DETECTED NOT DETECTED Final   Shiga like toxin producing E coli (STEC) NOT DETECTED NOT DETECTED Final   Shigella/Enteroinvasive E coli (EIEC) NOT DETECTED NOT DETECTED Final   Cryptosporidium NOT DETECTED NOT DETECTED Final   Cyclospora cayetanensis NOT DETECTED NOT DETECTED Final   Entamoeba histolytica NOT DETECTED NOT DETECTED Final   Giardia lamblia NOT DETECTED NOT DETECTED Final   Adenovirus F40/41 NOT DETECTED NOT DETECTED Final   Astrovirus NOT DETECTED NOT DETECTED Final   Norovirus GI/GII NOT DETECTED NOT DETECTED Final   Rotavirus A NOT DETECTED NOT DETECTED Final   Sapovirus (I, II, IV, and V) NOT DETECTED NOT DETECTED Final    Comment: Performed at [facility], [address], [place], [place] [address]  Urine Culture     Status: Abnormal   Collection Time: 05/05/18  7:42 PM  Result Value Ref Range Status   Specimen Description URINE, CLEAN CATCH  Final   Special Requests   Final    NONE Performed at [facility], [address] [place], [place] [address]    Culture >=100,000 COLONIES/mL ESCHERICHIA COLI (A)  Final   Report Status 05/07/2018 FINAL  Final   Organism ID, Bacteria ESCHERICHIA COLI (A)  Final      Susceptibility   Escherichia coli - MIC*    AMPICILLIN >=32 RESISTANT  Resistant     CEFAZOLIN <=4 SENSITIVE Sensitive     CEFTRIAXONE <=1 SENSITIVE Sensitive     CIPROFLOXACIN <=0.25 SENSITIVE Sensitive     GENTAMICIN <=1 SENSITIVE Sensitive     IMIPENEM <=0.25 SENSITIVE Sensitive     NITROFURANTOIN <=16 SENSITIVE Sensitive     TRIMETH/SULFA <=20 SENSITIVE Sensitive     AMPICILLIN/SULBACTAM 16 INTERMEDIATE Intermediate     PIP/TAZO <=4 SENSITIVE Sensitive     Extended ESBL NEGATIVE Sensitive     * >=100,000 COLONIES/mL ESCHERICHIA COLI  C difficile quick scan w PCR reflex     Status: None   Collection Time: 05/06/18  5:06 AM  Result Value Ref Range Status   C Diff antigen NEGATIVE NEGATIVE Final   C Diff toxin NEGATIVE NEGATIVE Final   C Diff interpretation No C. difficile detected.  Final  Comment: Performed at [facility], [place] [address], [place], [place] [address]     Labs: BNP (last 3 results) No results for input(s): BNP in the last 8760 hours. Basic Metabolic Panel: Recent Labs  Lab 05/05/18 1207 05/06/18 0238 05/08/18 0506  NA 134* 137 140  K 3.9 4.5 4.4  CL 103 107 111  CO2 23 23 21*  GLUCOSE 121* 101* 101*  BUN 38* 38* 30*  CREATININE 1.93* 1.85* 1.50*  CALCIUM 8.4* 8.1* 8.1*   Liver Function Tests: Recent Labs  Lab 05/05/18 1207  AST 17  ALT 15*  ALKPHOS 68  BILITOT 0.8  PROT 5.6*  ALBUMIN 2.7*   No results for input(s): LIPASE, AMYLASE in the last 168 hours. No results for input(s): AMMONIA in the last 168 hours. CBC: Recent Labs  Lab 05/05/18 1207  05/06/18 0238 05/06/18 0622 05/06/18 1038 05/07/18 0646 05/08/18 0506  WBC 10.3   < > 9.8 9.4 8.0 6.7 8.5  NEUTROABS 8.4*  --   --   --   --   --  5.9  HGB 9.6*   < > 8.4* 8.1* 8.2* 9.2* 9.5*  HCT 29.3*   < > 25.1* 24.6* 24.6* 27.9* 28.8*  MCV 99.3   < > 98.4 100.4* 99.2 98.6 98.6  PLT 378   < > 322 308 305 307 314   < > = values in this interval not displayed.   Cardiac Enzymes: No results for input(s): CKTOTAL, CKMB, CKMBINDEX, TROPONINI in the last  168 hours. BNP: Invalid input(s): POCBNP CBG: No results for input(s): GLUCAP in the last 168 hours. D-Dimer No results for input(s): DDIMER in the last 72 hours. Hgb A1c No results for input(s): HGBA1C in the last 72 hours. Lipid Profile No results for input(s): CHOL, HDL, LDLCALC, TRIG, CHOLHDL, LDLDIRECT in the last 72 hours. Thyroid function studies No results for input(s): TSH, T4TOTAL, T3FREE, THYROIDAB in the last 72 hours.  Invalid input(s): FREET3 Anemia work up No results for input(s): VITAMINB12, FOLATE, FERRITIN, TIBC, IRON, RETICCTPCT in the last 72 hours. Urinalysis    Component Value Date/Time   COLORURINE YELLOW 05/05/2018 1815   APPEARANCEUR TURBID (A) 05/05/2018 1815   LABSPEC 1.018 05/05/2018 1815   PHURINE 5.0 05/05/2018 1815   GLUCOSEU NEGATIVE 05/05/2018 1815   HGBUR MODERATE (A) 05/05/2018 1815   BILIRUBINUR SMALL (A) 05/05/2018 1815   KETONESUR 5 (A) 05/05/2018 1815   PROTEINUR 100 (A) 05/05/2018 1815   UROBILINOGEN 1.0 01/08/2009 1441   NITRITE NEGATIVE 05/05/2018 1815   LEUKOCYTESUR MODERATE (A) 05/05/2018 1815   Sepsis Labs Invalid input(s): PROCALCITONIN,  WBC,  LACTICIDVEN Microbiology Recent Results (from the past 240 hour(s))  Gastrointestinal Panel by PCR , Stool     Status: None   Collection Time: 05/05/18  5:36 PM  Result Value Ref Range Status   Campylobacter species NOT DETECTED NOT DETECTED Final   Plesimonas shigelloides NOT DETECTED NOT DETECTED Final   Salmonella species NOT DETECTED NOT DETECTED Final   Yersinia enterocolitica NOT DETECTED NOT DETECTED Final   Vibrio species NOT DETECTED NOT DETECTED Final   Vibrio cholerae NOT DETECTED NOT DETECTED Final   Enteroaggregative E coli (EAEC) NOT DETECTED NOT DETECTED Final   Enteropathogenic E coli (EPEC) NOT DETECTED NOT DETECTED Final   Enterotoxigenic E coli (ETEC) NOT DETECTED NOT DETECTED Final   Shiga like toxin producing E coli (STEC) NOT DETECTED NOT DETECTED Final    Shigella/Enteroinvasive E coli (EIEC) NOT DETECTED NOT DETECTED  Final   Cryptosporidium NOT DETECTED NOT DETECTED Final   Cyclospora cayetanensis NOT DETECTED NOT DETECTED Final   Entamoeba histolytica NOT DETECTED NOT DETECTED Final   Giardia lamblia NOT DETECTED NOT DETECTED Final   Adenovirus F40/41 NOT DETECTED NOT DETECTED Final   Astrovirus NOT DETECTED NOT DETECTED Final   Norovirus GI/GII NOT DETECTED NOT DETECTED Final   Rotavirus A NOT DETECTED NOT DETECTED Final   Sapovirus (I, II, IV, and V) NOT DETECTED NOT DETECTED Final    Comment: Performed at [facility], [address] [place], [place] [address]  Urine Culture     Status: Abnormal   Collection Time: 05/05/18  7:42 PM  Result Value Ref Range Status   Specimen Description URINE, CLEAN CATCH  Final   Special Requests   Final    NONE Performed at [facility], [place] [address], [place], [place] [address]    Culture >=100,000 COLONIES/mL ESCHERICHIA COLI (A)  Final   Report Status 05/07/2018 FINAL  Final   Organism ID, Bacteria ESCHERICHIA COLI (A)  Final      Susceptibility   Escherichia coli - MIC*    AMPICILLIN >=32 RESISTANT Resistant     CEFAZOLIN <=4 SENSITIVE Sensitive     CEFTRIAXONE <=1 SENSITIVE Sensitive     CIPROFLOXACIN <=0.25 SENSITIVE Sensitive     GENTAMICIN <=1 SENSITIVE Sensitive     IMIPENEM <=0.25 SENSITIVE Sensitive     NITROFURANTOIN <=16 SENSITIVE Sensitive     TRIMETH/SULFA <=20 SENSITIVE Sensitive     AMPICILLIN/SULBACTAM 16 INTERMEDIATE Intermediate     PIP/TAZO <=4 SENSITIVE Sensitive     Extended ESBL NEGATIVE Sensitive     * >=100,000 COLONIES/mL ESCHERICHIA COLI  C difficile quick scan w PCR reflex     Status: None   Collection Time: 05/06/18  5:06 AM  Result Value Ref Range Status   C Diff antigen NEGATIVE NEGATIVE Final   C Diff toxin NEGATIVE NEGATIVE Final   C Diff interpretation No C. difficile detected.  Final    Comment: Performed at [facility],  [place] [address], [place], [place] [address]     Time coordinating discharge: 36 minutes  SIGNED:   Georgette Shell, MD  [facility] 05/08/2018, 9:44 AM Pager   If 7PM-7AM, please contact night-coverage [email or web address] Password [email or web address]

## 2019-12-26 ENCOUNTER — Ambulatory Visit: Payer: Medicare Other | Attending: Internal Medicine

## 2019-12-26 DIAGNOSIS — Z23 Encounter for immunization: Secondary | ICD-10-CM | POA: Insufficient documentation

## 2019-12-26 NOTE — Progress Notes (Signed)
   Covid-19 Vaccination Clinic  Name:  Eric [name]    MRN: [id number] DOB: 1929-06-07  12/26/2019  [name] was observed post Covid-19 immunization for 15 minutes without incidence. He was provided with Vaccine Information Sheet and instruction to access the V-Safe system.   [name] was instructed to call 911 with any severe reactions post vaccine: Marland Kitchen Difficulty breathing  . Swelling of your face and throat  . A fast heartbeat  . A bad rash all over your body  . Dizziness and weakness    Immunizations Administered    Name Date Dose VIS Date Route   Pfizer COVID-19 Vaccine 12/26/2019 11:34 AM 0.3 mL 11/23/2019 Intramuscular   Manufacturer: [place]   Lot: [id number]   [place]: [id number]

## 2020-01-15 ENCOUNTER — Ambulatory Visit: Payer: Medicare Other | Attending: Internal Medicine

## 2020-01-15 DIAGNOSIS — Z23 Encounter for immunization: Secondary | ICD-10-CM

## 2020-01-15 NOTE — Progress Notes (Signed)
   Covid-19 Vaccination Clinic  Name:  Eric [name]    MRN: [id number] DOB: 07/18/1929  01/15/2020  Eric [name] was observed post Covid-19 immunization for 15 minutes without incidence. He was provided with Vaccine Information Sheet and instruction to access the V-Safe system.   Eric [name] was instructed to call 911 with any severe reactions post vaccine: Marland Kitchen Difficulty breathing  . Swelling of your face and throat  . A fast heartbeat  . A bad rash all over your body  . Dizziness and weakness    Immunizations Administered    Name Date Dose VIS Date Route   Pfizer COVID-19 Vaccine 01/15/2020 11:01 AM 0.3 mL 11/23/2019 Intramuscular   Manufacturer: [place]   Lot: [id number]   [place]: [id number]

## 2020-10-11 ENCOUNTER — Ambulatory Visit: Payer: Medicare Other | Attending: Internal Medicine

## 2020-10-11 DIAGNOSIS — Z23 Encounter for immunization: Secondary | ICD-10-CM

## 2020-10-11 NOTE — Progress Notes (Signed)
   Covid-19 Vaccination Clinic  Name:  Eric [name]    MRN: [id number] DOB: [birth year]  10/11/2020  Eric [name] was observed post Covid-19 immunization for 15 minutes without incident. He was provided with Vaccine Information Sheet and instruction to access the V-Safe system.   Eric [name] was instructed to call 911 with any severe reactions post vaccine: Marland Kitchen Difficulty breathing  . Swelling of face and throat  . A fast heartbeat  . A bad rash all over body  . Dizziness and weakness

## 2022-02-23 ENCOUNTER — Encounter (HOSPITAL_BASED_OUTPATIENT_CLINIC_OR_DEPARTMENT_OTHER): Payer: Self-pay | Admitting: Urology

## 2022-02-23 ENCOUNTER — Inpatient Hospital Stay (HOSPITAL_BASED_OUTPATIENT_CLINIC_OR_DEPARTMENT_OTHER)
Admission: EM | Admit: 2022-02-23 | Discharge: 2022-03-03 | DRG: 436 | Disposition: A | Discharge status: Home Health | Payer: Medicare Other | Attending: Internal Medicine | Admitting: Internal Medicine

## 2022-02-23 ENCOUNTER — Other Ambulatory Visit: Payer: Self-pay

## 2022-02-23 ENCOUNTER — Emergency Department (HOSPITAL_BASED_OUTPATIENT_CLINIC_OR_DEPARTMENT_OTHER): Payer: Medicare Other

## 2022-02-23 DIAGNOSIS — E440 Moderate protein-calorie malnutrition: Secondary | ICD-10-CM | POA: Insufficient documentation

## 2022-02-23 DIAGNOSIS — N3000 Acute cystitis without hematuria: Secondary | ICD-10-CM | POA: Diagnosis present

## 2022-02-23 DIAGNOSIS — Z96642 Presence of left artificial hip joint: Secondary | ICD-10-CM | POA: Diagnosis present

## 2022-02-23 DIAGNOSIS — Z882 Allergy status to sulfonamides status: Secondary | ICD-10-CM

## 2022-02-23 DIAGNOSIS — R17 Unspecified jaundice: Principal | ICD-10-CM

## 2022-02-23 DIAGNOSIS — K869 Disease of pancreas, unspecified: Secondary | ICD-10-CM | POA: Diagnosis present

## 2022-02-23 DIAGNOSIS — C241 Malignant neoplasm of ampulla of Vater: Secondary | ICD-10-CM | POA: Diagnosis not present

## 2022-02-23 DIAGNOSIS — Z20822 Contact with and (suspected) exposure to covid-19: Secondary | ICD-10-CM | POA: Diagnosis present

## 2022-02-23 DIAGNOSIS — E669 Obesity, unspecified: Secondary | ICD-10-CM | POA: Diagnosis present

## 2022-02-23 DIAGNOSIS — K831 Obstruction of bile duct: Secondary | ICD-10-CM

## 2022-02-23 DIAGNOSIS — K2210 Ulcer of esophagus without bleeding: Secondary | ICD-10-CM | POA: Diagnosis present

## 2022-02-23 DIAGNOSIS — K209 Esophagitis, unspecified without bleeding: Secondary | ICD-10-CM

## 2022-02-23 DIAGNOSIS — D539 Nutritional anemia, unspecified: Secondary | ICD-10-CM | POA: Diagnosis present

## 2022-02-23 DIAGNOSIS — N390 Urinary tract infection, site not specified: Secondary | ICD-10-CM

## 2022-02-23 DIAGNOSIS — E538 Deficiency of other specified B group vitamins: Secondary | ICD-10-CM | POA: Diagnosis present

## 2022-02-23 DIAGNOSIS — Z6835 Body mass index (BMI) 35.0-35.9, adult: Secondary | ICD-10-CM

## 2022-02-23 DIAGNOSIS — Z87442 Personal history of urinary calculi: Secondary | ICD-10-CM

## 2022-02-23 DIAGNOSIS — K8021 Calculus of gallbladder without cholecystitis with obstruction: Secondary | ICD-10-CM | POA: Diagnosis present

## 2022-02-23 DIAGNOSIS — R509 Fever, unspecified: Secondary | ICD-10-CM

## 2022-02-23 DIAGNOSIS — K3189 Other diseases of stomach and duodenum: Secondary | ICD-10-CM | POA: Diagnosis present

## 2022-02-23 DIAGNOSIS — R7989 Other specified abnormal findings of blood chemistry: Secondary | ICD-10-CM | POA: Diagnosis present

## 2022-02-23 DIAGNOSIS — N1832 Chronic kidney disease, stage 3b: Secondary | ICD-10-CM | POA: Diagnosis present

## 2022-02-23 DIAGNOSIS — L299 Pruritus, unspecified: Secondary | ICD-10-CM | POA: Diagnosis present

## 2022-02-23 DIAGNOSIS — D638 Anemia in other chronic diseases classified elsewhere: Secondary | ICD-10-CM | POA: Diagnosis present

## 2022-02-23 DIAGNOSIS — K219 Gastro-esophageal reflux disease without esophagitis: Secondary | ICD-10-CM

## 2022-02-23 DIAGNOSIS — E872 Acidosis, unspecified: Secondary | ICD-10-CM

## 2022-02-23 DIAGNOSIS — R1011 Right upper quadrant pain: Secondary | ICD-10-CM

## 2022-02-23 DIAGNOSIS — R935 Abnormal findings on diagnostic imaging of other abdominal regions, including retroperitoneum: Secondary | ICD-10-CM

## 2022-02-23 DIAGNOSIS — Z87891 Personal history of nicotine dependence: Secondary | ICD-10-CM

## 2022-02-23 DIAGNOSIS — N179 Acute kidney failure, unspecified: Secondary | ICD-10-CM | POA: Diagnosis not present

## 2022-02-23 DIAGNOSIS — I129 Hypertensive chronic kidney disease with stage 1 through stage 4 chronic kidney disease, or unspecified chronic kidney disease: Secondary | ICD-10-CM | POA: Diagnosis present

## 2022-02-23 DIAGNOSIS — N1831 Chronic kidney disease, stage 3a: Secondary | ICD-10-CM | POA: Insufficient documentation

## 2022-02-23 DIAGNOSIS — Z79899 Other long term (current) drug therapy: Secondary | ICD-10-CM

## 2022-02-23 DIAGNOSIS — R8271 Bacteriuria: Secondary | ICD-10-CM | POA: Insufficient documentation

## 2022-02-23 DIAGNOSIS — K838 Other specified diseases of biliary tract: Secondary | ICD-10-CM

## 2022-02-23 DIAGNOSIS — E8721 Acute metabolic acidosis: Secondary | ICD-10-CM | POA: Diagnosis present

## 2022-02-23 DIAGNOSIS — K8689 Other specified diseases of pancreas: Secondary | ICD-10-CM | POA: Diagnosis present

## 2022-02-23 DIAGNOSIS — Z886 Allergy status to analgesic agent status: Secondary | ICD-10-CM

## 2022-02-23 DIAGNOSIS — I899 Noninfective disorder of lymphatic vessels and lymph nodes, unspecified: Secondary | ICD-10-CM | POA: Diagnosis present

## 2022-02-23 DIAGNOSIS — D72829 Elevated white blood cell count, unspecified: Secondary | ICD-10-CM

## 2022-02-23 DIAGNOSIS — I10 Essential (primary) hypertension: Secondary | ICD-10-CM

## 2022-02-23 DIAGNOSIS — R748 Abnormal levels of other serum enzymes: Secondary | ICD-10-CM

## 2022-02-23 DIAGNOSIS — Z85048 Personal history of other malignant neoplasm of rectum, rectosigmoid junction, and anus: Secondary | ICD-10-CM

## 2022-02-23 DIAGNOSIS — K2970 Gastritis, unspecified, without bleeding: Secondary | ICD-10-CM | POA: Diagnosis present

## 2022-02-23 DIAGNOSIS — K2100 Gastro-esophageal reflux disease with esophagitis, without bleeding: Secondary | ICD-10-CM | POA: Diagnosis present

## 2022-02-23 DIAGNOSIS — Z96651 Presence of right artificial knee joint: Secondary | ICD-10-CM | POA: Diagnosis present

## 2022-02-23 DIAGNOSIS — E871 Hypo-osmolality and hyponatremia: Secondary | ICD-10-CM | POA: Diagnosis present

## 2022-02-23 DIAGNOSIS — N400 Enlarged prostate without lower urinary tract symptoms: Secondary | ICD-10-CM | POA: Diagnosis present

## 2022-02-23 DIAGNOSIS — Z888 Allergy status to other drugs, medicaments and biological substances status: Secondary | ICD-10-CM

## 2022-02-23 DIAGNOSIS — C249 Malignant neoplasm of biliary tract, unspecified: Secondary | ICD-10-CM

## 2022-02-23 HISTORY — DX: Malignant (primary) neoplasm, unspecified: C80.1

## 2022-02-23 LAB — URINALYSIS, MICROSCOPIC (REFLEX)

## 2022-02-23 LAB — URINALYSIS, ROUTINE W REFLEX MICROSCOPIC
Glucose, UA: 100 mg/dL — AB
Hgb urine dipstick: NEGATIVE
Ketones, ur: NEGATIVE mg/dL
Nitrite: NEGATIVE
Protein, ur: 30 mg/dL — AB
Specific Gravity, Urine: 1.025 (ref 1.005–1.030)
pH: 5.5 (ref 5.0–8.0)

## 2022-02-23 LAB — COMPREHENSIVE METABOLIC PANEL
ALT: 240 U/L — ABNORMAL HIGH (ref 0–44)
AST: 153 U/L — ABNORMAL HIGH (ref 15–41)
Albumin: 3 g/dL — ABNORMAL LOW (ref 3.5–5.0)
Alkaline Phosphatase: 438 U/L — ABNORMAL HIGH (ref 38–126)
Anion gap: 10 (ref 5–15)
BUN: 22 mg/dL (ref 8–23)
CO2: 21 mmol/L — ABNORMAL LOW (ref 22–32)
Calcium: 8.5 mg/dL — ABNORMAL LOW (ref 8.9–10.3)
Chloride: 100 mmol/L (ref 98–111)
Creatinine, Ser: 1.45 mg/dL — ABNORMAL HIGH (ref 0.61–1.24)
GFR, Estimated: 46 mL/min — ABNORMAL LOW (ref >60)
Glucose, Bld: 116 mg/dL — ABNORMAL HIGH (ref 70–99)
Potassium: 3.9 mmol/L (ref 3.5–5.1)
Sodium: 131 mmol/L — ABNORMAL LOW (ref 135–145)
Total Bilirubin: 11.2 mg/dL — ABNORMAL HIGH (ref 0.3–1.2)
Total Protein: 6 g/dL — ABNORMAL LOW (ref 6.5–8.1)

## 2022-02-23 LAB — CBC WITH DIFFERENTIAL/PLATELET
Abs Immature Granulocytes: 0.04 10*3/uL (ref 0.00–0.07)
Basophils Absolute: 0 10*3/uL (ref 0.0–0.1)
Basophils Relative: 1 %
Eosinophils Absolute: 0.3 10*3/uL (ref 0.0–0.5)
Eosinophils Relative: 5 %
HCT: 33.4 % — ABNORMAL LOW (ref 39.0–52.0)
Hemoglobin: 11.7 g/dL — ABNORMAL LOW (ref 13.0–17.0)
Immature Granulocytes: 1 %
Lymphocytes Relative: 7 %
Lymphs Abs: 0.4 10*3/uL — ABNORMAL LOW (ref 0.7–4.0)
MCH: 35.2 pg — ABNORMAL HIGH (ref 26.0–34.0)
MCHC: 35 g/dL (ref 30.0–36.0)
MCV: 100.6 fL — ABNORMAL HIGH (ref 80.0–100.0)
Monocytes Absolute: 0.7 10*3/uL (ref 0.1–1.0)
Monocytes Relative: 13 %
Neutro Abs: 4.1 10*3/uL (ref 1.7–7.7)
Neutrophils Relative %: 73 %
Platelets: 262 10*3/uL (ref 150–400)
RBC: 3.32 MIL/uL — ABNORMAL LOW (ref 4.22–5.81)
RDW: 15.3 % (ref 11.5–15.5)
WBC: 5.6 10*3/uL (ref 4.0–10.5)
nRBC: 0 % (ref 0.0–0.2)

## 2022-02-23 LAB — LIPASE, BLOOD: Lipase: 112 U/L — ABNORMAL HIGH (ref 11–51)

## 2022-02-23 MED ORDER — IOHEXOL 300 MG/ML  SOLN
100.0000 mL | Freq: Once | INTRAMUSCULAR | Status: AC | PRN
  Administered 2022-02-23: 100 mL via INTRAVENOUS

## 2022-02-23 NOTE — ED Notes (Signed)
With Ct 

## 2022-02-23 NOTE — ED Triage Notes (Signed)
Pt states urine dark brown, states itching all over  ?Denies any dysuria  ?H/o kidney disease and rectal cancer  ? ?

## 2022-02-23 NOTE — ED Provider Notes (Addendum)
?[facility] EMERGENCY DEPARTMENT ?Provider Note ? ? ?CSN: [id number] ?Arrival date & time: 02/23/22  1842 ? ?  ? ?History ? ?Chief Complaint  ?Patient presents with  ? Pruritis  ? ? ?Eric [name] is a 88 y.o. male. ? ?Patient here with complaint of dark urine and itching all over the past couple days.  Patient's daughter states that the skin looks were yellow today.  It was not that way this past weekend.  So this is new.  Could be related to the itching as well.  Patient's past medical history is significant for having chronic kidney disease stage III followed by nephrology last saw them in December 2022.  Also known to have rectal CA in remission last saw oncology at the [facility] area in September 2022. ? ?Urinalysis is already back here today is significant for white blood cells 21-50 many bacteria and bilirubin is elevated in that. ? ?Past medical history sniffing for hypertension spinal stenosis benign prostatic hyperplasia history of kidney stones.  The rectal cancer in remission chronic kidney disease.  Patient is a former smoker. ? ? ?  ? ?Home Medications ?Prior to Admission medications   ?Medication Sig Start Date End Date Taking? Authorizing Provider  ?acetaminophen (TYLENOL) 500 MG tablet Take 500-1,000 mg by mouth every 6 (six) hours as needed (for pain).    [provider]  ?cholestyramine (QUESTRAN) 4 g packet Take 1 packet (4 g total) by mouth 3 (three) times daily. 05/08/18   Georgette Shell, MD  ?pantoprazole (PROTONIX) 40 MG tablet Take 1 tablet (40 mg total) by mouth daily. 05/08/18 05/08/19  Georgette Shell, MD  ?saccharomyces boulardii (FLORASTOR) 250 MG capsule Take 1 capsule (250 mg total) by mouth 2 (two) times daily. 05/08/18   Georgette Shell, MD  ?tiZANidine (ZANAFLEX) 2 MG tablet Take 1 tablet (2 mg total) by mouth every 6 (six) hours as needed. ?Patient taking differently: Take 2 mg by mouth every 6 (six) hours as needed for muscle spasms.  04/19/18    Leighton Parody, PA-C  ?   ? ?Allergies    ?Nsaids, Sulfa antibiotics, and Tolmetin   ? ?Review of Systems   ?Review of Systems  ?Constitutional:  Negative for chills and fever.  ?HENT:  Negative for ear pain and sore throat.   ?Eyes:  Negative for pain and visual disturbance.  ?Respiratory:  Negative for cough and shortness of breath.   ?Cardiovascular:  Negative for chest pain and palpitations.  ?Gastrointestinal:  Negative for abdominal pain and vomiting.  ?Genitourinary:  Negative for dysuria and hematuria.  ?Musculoskeletal:  Negative for arthralgias and back pain.  ?Skin:  Negative for color change and rash.  ?Neurological:  Negative for seizures and syncope.  ?All other systems reviewed and are negative. ? ?Physical Exam ?Updated Vital Signs ?BP 124/86   Pulse (!) 115   Temp 98.1 ?F (36.7 ?C) (Oral)   Resp 17   Ht 1.753 m ('5\' 9"'$ )   Wt 108.4 kg   SpO2 98%   BMI 35.29 kg/m?  ?Physical Exam ?Vitals and nursing note reviewed.  ?Constitutional:   ?   General: He is not in acute distress. ?   Appearance: Normal appearance. He is well-developed.  ?HENT:  ?   Head: Normocephalic and atraumatic.  ?Eyes:  ?   General: Scleral icterus present.  ?   Extraocular Movements: Extraocular movements intact.  ?   Conjunctiva/sclera: Conjunctivae normal.  ?   Pupils: Pupils are  equal, round, and reactive to light.  ?Cardiovascular:  ?   Rate and Rhythm: Normal rate and regular rhythm.  ?   Heart sounds: No murmur heard. ?Pulmonary:  ?   Effort: Pulmonary effort is normal. No respiratory distress.  ?   Breath sounds: Normal breath sounds.  ?Abdominal:  ?   General: There is no distension.  ?   Palpations: Abdomen is soft.  ?   Tenderness: There is no abdominal tenderness. There is no guarding.  ?Musculoskeletal:     ?   General: No swelling.  ?   Cervical back: Normal range of motion and neck supple.  ?Skin: ?   General: Skin is warm and dry.  ?   Capillary Refill: Capillary refill takes less than 2 seconds.  ?    Coloration: Skin is jaundiced.  ?Neurological:  ?   General: No focal deficit present.  ?   Mental Status: He is alert and oriented to person, place, and time.  ?   Cranial Nerves: Cranial nerve deficit present.  ?   Sensory: Sensory deficit present.  ?   Motor: No weakness.  ?   Coordination: Coordination normal.  ?Psychiatric:     ?   Mood and Affect: Mood normal.  ? ? ?ED Results / Procedures / Treatments   ?Labs ?(all labs ordered are listed, but only abnormal results are displayed) ?Labs Reviewed  ?URINALYSIS, ROUTINE W REFLEX MICROSCOPIC - Abnormal; Notable for the following components:  ?    Result Value  ? Color, Urine AMBER (*)   ? APPearance CLOUDY (*)   ? Glucose, UA 100 (*)   ? Bilirubin Urine LARGE (*)   ? Protein, ur 30 (*)   ? Leukocytes,Ua LARGE (*)   ? All other components within normal limits  ?URINALYSIS, MICROSCOPIC (REFLEX) - Abnormal; Notable for the following components:  ? Bacteria, UA MANY (*)   ? All other components within normal limits  ?CBC WITH DIFFERENTIAL/PLATELET - Abnormal; Notable for the following components:  ? RBC 3.32 (*)   ? Hemoglobin 11.7 (*)   ? HCT 33.4 (*)   ? MCV 100.6 (*)   ? MCH 35.2 (*)   ? Lymphs Abs 0.4 (*)   ? All other components within normal limits  ?COMPREHENSIVE METABOLIC PANEL - Abnormal; Notable for the following components:  ? Sodium 131 (*)   ? CO2 21 (*)   ? Glucose, Bld 116 (*)   ? Creatinine, Ser 1.45 (*)   ? Calcium 8.5 (*)   ? Total Protein 6.0 (*)   ? Albumin 3.0 (*)   ? AST 153 (*)   ? ALT 240 (*)   ? Alkaline Phosphatase 438 (*)   ? Total Bilirubin 11.2 (*)   ? GFR, Estimated 46 (*)   ? All other components within normal limits  ?LIPASE, BLOOD - Abnormal; Notable for the following components:  ? Lipase 112 (*)   ? All other components within normal limits  ?URINE CULTURE  ?RESP PANEL BY RT-PCR (FLU A&B, COVID) [id number]  ? ? ?EKG ?EKG Interpretation ? ?Date/Time:  Tuesday February 23 2022 21:14:31 EDT ?Ventricular Rate:  103 ?PR Interval:  158 ?QRS  Duration: 108 ?QT Interval:  368 ?QTC Calculation: 482 ?R Axis:   17 ?Text Interpretation: Sinus tachycardia Low voltage, extremity and precordial leads Borderline prolonged QT interval Artifact Confirmed by Fredia Sorrow [phone]) on 02/23/2022 9:31:23 PM ? ?Radiology ?CT Abdomen Pelvis W Contrast ? ?Result Date: 02/23/2022 ?CLINICAL DATA:  Abdominal pain. EXAM: CT ABDOMEN AND PELVIS WITH CONTRAST TECHNIQUE: Multidetector CT imaging of the abdomen and pelvis was performed using the standard protocol following bolus administration of intravenous contrast. RADIATION DOSE REDUCTION: This exam was performed according to the departmental dose-optimization program which includes automated exposure control, adjustment of the mA and/or kV according to patient size and/or use of iterative reconstruction technique. CONTRAST:  173m OMNIPAQUE IOHEXOL 300 MG/ML  SOLN COMPARISON:  CT abdomen pelvis dated 08/31/2021. FINDINGS: Lower chest: The visualized lung bases are clear. There is coronary vascular calcification. No intra-abdominal free air or free fluid. Hepatobiliary: Several small scattered calcified liver granuloma. The liver is otherwise unremarkable. There is mild biliary ductal dilatation. No calcified gallstone or pericholecystic fluid. Pancreas: The pancreas is mildly atrophic. There is mild dilatation of the main pancreatic duct. No active inflammatory changes. Spleen: Normal in size without focal abnormality. Adrenals/Urinary Tract: The adrenal glands unremarkable. Mild bilateral renal parenchyma atrophy. There is no hydronephrosis on either side. There is symmetric enhancement and excretion of contrast by both kidneys. There is a 2 cm left renal inferior pole cyst. Several additional subcentimeter hypodense lesions are too small to characterize. The visualized ureters appear unremarkable. There is mild trabeculated appearance of the bladder wall likely related to chronic bladder outlet obstruction. Correlation  with urinalysis recommended to exclude superimposed UTI. Stomach/Bowel: There is no bowel obstruction or active inflammation. The appendix is normal. Vascular/Lymphatic: Advanced aortoiliac atherosclerotic disease. The IVC

## 2022-02-24 ENCOUNTER — Inpatient Hospital Stay (HOSPITAL_COMMUNITY): Payer: Medicare Other

## 2022-02-24 ENCOUNTER — Emergency Department (HOSPITAL_BASED_OUTPATIENT_CLINIC_OR_DEPARTMENT_OTHER): Payer: Medicare Other

## 2022-02-24 DIAGNOSIS — E871 Hypo-osmolality and hyponatremia: Secondary | ICD-10-CM | POA: Diagnosis present

## 2022-02-24 DIAGNOSIS — E538 Deficiency of other specified B group vitamins: Secondary | ICD-10-CM | POA: Diagnosis present

## 2022-02-24 DIAGNOSIS — R7989 Other specified abnormal findings of blood chemistry: Secondary | ICD-10-CM

## 2022-02-24 DIAGNOSIS — K208 Other esophagitis without bleeding: Secondary | ICD-10-CM | POA: Diagnosis not present

## 2022-02-24 DIAGNOSIS — Z96651 Presence of right artificial knee joint: Secondary | ICD-10-CM | POA: Diagnosis present

## 2022-02-24 DIAGNOSIS — D539 Nutritional anemia, unspecified: Secondary | ICD-10-CM | POA: Diagnosis present

## 2022-02-24 DIAGNOSIS — E669 Obesity, unspecified: Secondary | ICD-10-CM | POA: Diagnosis present

## 2022-02-24 DIAGNOSIS — I10 Essential (primary) hypertension: Secondary | ICD-10-CM

## 2022-02-24 DIAGNOSIS — K831 Obstruction of bile duct: Secondary | ICD-10-CM | POA: Diagnosis not present

## 2022-02-24 DIAGNOSIS — N179 Acute kidney failure, unspecified: Secondary | ICD-10-CM | POA: Diagnosis not present

## 2022-02-24 DIAGNOSIS — K2100 Gastro-esophageal reflux disease with esophagitis, without bleeding: Secondary | ICD-10-CM | POA: Diagnosis present

## 2022-02-24 DIAGNOSIS — L299 Pruritus, unspecified: Secondary | ICD-10-CM | POA: Diagnosis present

## 2022-02-24 DIAGNOSIS — Z85048 Personal history of other malignant neoplasm of rectum, rectosigmoid junction, and anus: Secondary | ICD-10-CM

## 2022-02-24 DIAGNOSIS — K8021 Calculus of gallbladder without cholecystitis with obstruction: Secondary | ICD-10-CM | POA: Diagnosis present

## 2022-02-24 DIAGNOSIS — N183 Chronic kidney disease, stage 3 unspecified: Secondary | ICD-10-CM | POA: Diagnosis not present

## 2022-02-24 DIAGNOSIS — I129 Hypertensive chronic kidney disease with stage 1 through stage 4 chronic kidney disease, or unspecified chronic kidney disease: Secondary | ICD-10-CM | POA: Diagnosis present

## 2022-02-24 DIAGNOSIS — N1832 Chronic kidney disease, stage 3b: Secondary | ICD-10-CM | POA: Diagnosis present

## 2022-02-24 DIAGNOSIS — K8689 Other specified diseases of pancreas: Secondary | ICD-10-CM | POA: Diagnosis present

## 2022-02-24 DIAGNOSIS — C241 Malignant neoplasm of ampulla of Vater: Secondary | ICD-10-CM | POA: Diagnosis present

## 2022-02-24 DIAGNOSIS — K8020 Calculus of gallbladder without cholecystitis without obstruction: Secondary | ICD-10-CM

## 2022-02-24 DIAGNOSIS — N3000 Acute cystitis without hematuria: Secondary | ICD-10-CM | POA: Diagnosis present

## 2022-02-24 DIAGNOSIS — E8721 Acute metabolic acidosis: Secondary | ICD-10-CM | POA: Diagnosis present

## 2022-02-24 DIAGNOSIS — K2970 Gastritis, unspecified, without bleeding: Secondary | ICD-10-CM | POA: Diagnosis present

## 2022-02-24 DIAGNOSIS — R17 Unspecified jaundice: Secondary | ICD-10-CM | POA: Diagnosis not present

## 2022-02-24 DIAGNOSIS — R748 Abnormal levels of other serum enzymes: Secondary | ICD-10-CM | POA: Diagnosis not present

## 2022-02-24 DIAGNOSIS — K838 Other specified diseases of biliary tract: Secondary | ICD-10-CM | POA: Diagnosis present

## 2022-02-24 DIAGNOSIS — I899 Noninfective disorder of lymphatic vessels and lymph nodes, unspecified: Secondary | ICD-10-CM | POA: Diagnosis present

## 2022-02-24 DIAGNOSIS — R8271 Bacteriuria: Secondary | ICD-10-CM | POA: Insufficient documentation

## 2022-02-24 DIAGNOSIS — R935 Abnormal findings on diagnostic imaging of other abdominal regions, including retroperitoneum: Secondary | ICD-10-CM

## 2022-02-24 DIAGNOSIS — K2210 Ulcer of esophagus without bleeding: Secondary | ICD-10-CM | POA: Diagnosis present

## 2022-02-24 DIAGNOSIS — D638 Anemia in other chronic diseases classified elsewhere: Secondary | ICD-10-CM | POA: Diagnosis present

## 2022-02-24 DIAGNOSIS — Z20822 Contact with and (suspected) exposure to covid-19: Secondary | ICD-10-CM | POA: Diagnosis present

## 2022-02-24 DIAGNOSIS — E440 Moderate protein-calorie malnutrition: Secondary | ICD-10-CM | POA: Diagnosis present

## 2022-02-24 DIAGNOSIS — N400 Enlarged prostate without lower urinary tract symptoms: Secondary | ICD-10-CM | POA: Diagnosis not present

## 2022-02-24 DIAGNOSIS — K3189 Other diseases of stomach and duodenum: Secondary | ICD-10-CM | POA: Diagnosis not present

## 2022-02-24 DIAGNOSIS — D631 Anemia in chronic kidney disease: Secondary | ICD-10-CM | POA: Diagnosis not present

## 2022-02-24 DIAGNOSIS — Z96642 Presence of left artificial hip joint: Secondary | ICD-10-CM | POA: Diagnosis present

## 2022-02-24 DIAGNOSIS — R634 Abnormal weight loss: Secondary | ICD-10-CM

## 2022-02-24 DIAGNOSIS — N390 Urinary tract infection, site not specified: Secondary | ICD-10-CM

## 2022-02-24 DIAGNOSIS — K869 Disease of pancreas, unspecified: Secondary | ICD-10-CM | POA: Diagnosis present

## 2022-02-24 LAB — CBC WITH DIFFERENTIAL/PLATELET
Abs Immature Granulocytes: 0.04 10*3/uL (ref 0.00–0.07)
Basophils Absolute: 0.1 10*3/uL (ref 0.0–0.1)
Basophils Relative: 1 %
Eosinophils Absolute: 0.3 10*3/uL (ref 0.0–0.5)
Eosinophils Relative: 4 %
HCT: 32.1 % — ABNORMAL LOW (ref 39.0–52.0)
Hemoglobin: 11 g/dL — ABNORMAL LOW (ref 13.0–17.0)
Immature Granulocytes: 1 %
Lymphocytes Relative: 5 %
Lymphs Abs: 0.3 10*3/uL — ABNORMAL LOW (ref 0.7–4.0)
MCH: 35.4 pg — ABNORMAL HIGH (ref 26.0–34.0)
MCHC: 34.3 g/dL (ref 30.0–36.0)
MCV: 103.2 fL — ABNORMAL HIGH (ref 80.0–100.0)
Monocytes Absolute: 0.9 10*3/uL (ref 0.1–1.0)
Monocytes Relative: 14 %
Neutro Abs: 5 10*3/uL (ref 1.7–7.7)
Neutrophils Relative %: 75 %
Platelets: 232 10*3/uL (ref 150–400)
RBC: 3.11 MIL/uL — ABNORMAL LOW (ref 4.22–5.81)
RDW: 15.9 % — ABNORMAL HIGH (ref 11.5–15.5)
WBC: 6.6 10*3/uL (ref 4.0–10.5)
nRBC: 0 % (ref 0.0–0.2)

## 2022-02-24 LAB — COMPREHENSIVE METABOLIC PANEL
ALT: 216 U/L — ABNORMAL HIGH (ref 0–44)
AST: 159 U/L — ABNORMAL HIGH (ref 15–41)
Albumin: 2.8 g/dL — ABNORMAL LOW (ref 3.5–5.0)
Alkaline Phosphatase: 413 U/L — ABNORMAL HIGH (ref 38–126)
Anion gap: 7 (ref 5–15)
BUN: 21 mg/dL (ref 8–23)
CO2: 21 mmol/L — ABNORMAL LOW (ref 22–32)
Calcium: 8.4 mg/dL — ABNORMAL LOW (ref 8.9–10.3)
Chloride: 104 mmol/L (ref 98–111)
Creatinine, Ser: 1.36 mg/dL — ABNORMAL HIGH (ref 0.61–1.24)
GFR, Estimated: 49 mL/min — ABNORMAL LOW (ref >60)
Glucose, Bld: 102 mg/dL — ABNORMAL HIGH (ref 70–99)
Potassium: 4.2 mmol/L (ref 3.5–5.1)
Sodium: 132 mmol/L — ABNORMAL LOW (ref 135–145)
Total Bilirubin: 9.8 mg/dL — ABNORMAL HIGH (ref 0.3–1.2)
Total Protein: 5.3 g/dL — ABNORMAL LOW (ref 6.5–8.1)

## 2022-02-24 LAB — PROTIME-INR
INR: 1 (ref 0.8–1.2)
Prothrombin Time: 13 seconds (ref 11.4–15.2)

## 2022-02-24 LAB — RESP PANEL BY RT-PCR (FLU A&B, COVID) ARPGX2
Influenza A by PCR: NEGATIVE
Influenza B by PCR: NEGATIVE
SARS Coronavirus 2 by RT PCR: NEGATIVE

## 2022-02-24 MED ORDER — OXYCODONE HCL 5 MG PO TABS: 5.0000 mg | ORAL_TABLET | ORAL | Status: DC | PRN

## 2022-02-24 MED ORDER — ACETAMINOPHEN 325 MG PO TABS: 325.0000 mg | ORAL_TABLET | Freq: Four times a day (QID) | ORAL | Status: DC | PRN

## 2022-02-24 MED ORDER — VITAMIN B-12 100 MCG PO TABS
100.0000 ug | ORAL_TABLET | Freq: Every day | ORAL | Status: DC
  Administered 2022-02-24 – 2022-03-03 (×6): 100 ug via ORAL
  Filled 2022-02-24 (×8): qty 1

## 2022-02-24 MED ORDER — ONDANSETRON HCL 4 MG PO TABS: 4.0000 mg | ORAL_TABLET | Freq: Four times a day (QID) | ORAL | Status: DC | PRN

## 2022-02-24 MED ORDER — ACETAMINOPHEN 650 MG RE SUPP
325.0000 mg | Freq: Four times a day (QID) | RECTAL | Status: DC | PRN
  Administered 2022-03-01: 325 mg via RECTAL
  Filled 2022-02-24: qty 1

## 2022-02-24 MED ORDER — ONDANSETRON HCL 4 MG/2ML IJ SOLN: 4.0000 mg | Freq: Four times a day (QID) | INTRAMUSCULAR | Status: DC | PRN

## 2022-02-24 MED ORDER — LISINOPRIL 10 MG PO TABS
10.0000 mg | ORAL_TABLET | Freq: Every day | ORAL | Status: DC
  Administered 2022-02-24: 10 mg via ORAL
  Filled 2022-02-24: qty 1

## 2022-02-24 MED ORDER — SODIUM CHLORIDE 0.9 % IV SOLN
2.0000 g | INTRAVENOUS | Status: DC
  Administered 2022-02-25 (×2): 2 g via INTRAVENOUS
  Filled 2022-02-24 (×2): qty 20

## 2022-02-24 MED ORDER — LACTATED RINGERS IV BOLUS
1000.0000 mL | Freq: Once | INTRAVENOUS | Status: AC
  Administered 2022-02-24: 1000 mL via INTRAVENOUS

## 2022-02-24 MED ORDER — SODIUM CHLORIDE 0.9 % IV SOLN: INTRAVENOUS | Status: DC

## 2022-02-24 MED ORDER — GADOBUTROL 1 MMOL/ML IV SOLN
10.0000 mL | Freq: Once | INTRAVENOUS | Status: AC | PRN
  Administered 2022-02-24: 10 mL via INTRAVENOUS

## 2022-02-24 MED ORDER — SODIUM CHLORIDE 0.9 % IV SOLN
1.0000 g | Freq: Once | INTRAVENOUS | Status: AC
  Administered 2022-02-24: 1 g via INTRAVENOUS
  Filled 2022-02-24: qty 10

## 2022-02-24 NOTE — ED Notes (Signed)
Called [facility] for transportation update. Eric [name] is scheduled after shift change.  ?

## 2022-02-24 NOTE — ED Notes (Signed)
Report given to Lauren RN. Patient is ready for transport.  ?

## 2022-02-24 NOTE — H&P (Signed)
?History and Physical  ? ? ?Patient: Eric [name] CXK:[id number] DOB: 21-Oct-1929 ?DOA: 02/23/2022 ?DOS: the patient was seen and examined on 02/24/2022 ?PCP: Irish Lack., PA-C  ?Patient coming from: Home ? ?Chief Complaint:  ?Chief Complaint  ?Patient presents with  ? Pruritis  ? ?HPI: Eric [name] is a 88 y.o. male with medical history significant of HTN, BPH, colorectal cancer. Presenting 4 days of dark urine and pruritis. He noticed that his urine was pretty dark 4 days ago. There was no increased frequency or pain with urination. His appetite and water intake have been ok. He noticed that during that same time he was more yellow and has itching all over. He didn't try any medications for it. When his symptoms didn't resolve last night, he decided he would go to the ED for assistance. He denies any other aggravating or alleviating factors.  ?  ?Review of Systems: As mentioned in the history of present illness. All other systems reviewed and are negative. ?Past Medical History:  ?Diagnosis Date  ? BPH (benign prostatic hyperplasia)   ? Cancer [facility])   ? CKD (chronic kidney disease), stage III ([place])   ? History of kidney stones   ? Hypertension   ? Osteoarthritis of right knee   ? Spinal stenosis   ? ?Past Surgical History:  ?Procedure Laterality Date  ? CARPAL TUNNEL RELEASE Right 09/29/2016  ? Procedure: CARPAL TUNNEL RELEASE;  Surgeon: Frederik Pear, MD;  Location: [place];  Service: Orthopedics;  Laterality: Right;  ? cataract surgery Bilateral ~15 yrs ago  ? ESOPHAGOGASTRODUODENOSCOPY (EGD) WITH PROPOFOL N/A 05/07/2018  ? Procedure: ESOPHAGOGASTRODUODENOSCOPY (EGD) WITH PROPOFOL;  Surgeon: Wilford Corner, MD;  Location: [place];  Service: Endoscopy;  Laterality: N/A;  ? EYE SURGERY    ? Bilateral  ? HIP ARTHROPLASTY    ? Left  ? JOINT REPLACEMENT    ? hip  ? TONSILLECTOMY    ? TOTAL KNEE ARTHROPLASTY Right 04/19/2018  ? Procedure: TOTAL KNEE ARTHROPLASTY;  Surgeon: Frederik Pear,  MD;  Location: [place];  Service: Orthopedics;  Laterality: Right;  ? ?Social History:  reports that he has quit smoking. His smoking use included cigarettes. He has never used smokeless tobacco. He reports that he does not drink alcohol and does not use drugs. ? ?Allergies  ?Allergen Reactions  ? Nsaids Other (See Comments)  ?  Renal insufficiency ?  ? Sulfa Antibiotics Other (See Comments)  ?  Renal insufficiency at age 49  ? Tolmetin Rash and Other (See Comments)  ?  Renal insufficiency  ? ? ?History reviewed. No pertinent family history. ? ?Prior to Admission medications   ?Medication Sig Start Date End Date Taking? Authorizing Provider  ?acetaminophen (TYLENOL) 500 MG tablet Take 500-1,000 mg by mouth every 6 (six) hours as needed (for pain).    [provider]  ?cholestyramine (QUESTRAN) 4 g packet Take 1 packet (4 g total) by mouth 3 (three) times daily. 05/08/18   Georgette Shell, MD  ?pantoprazole (PROTONIX) 40 MG tablet Take 1 tablet (40 mg total) by mouth daily. 05/08/18 05/08/19  Georgette Shell, MD  ?saccharomyces boulardii (FLORASTOR) 250 MG capsule Take 1 capsule (250 mg total) by mouth 2 (two) times daily. 05/08/18   Georgette Shell, MD  ?tiZANidine (ZANAFLEX) 2 MG tablet Take 1 tablet (2 mg total) by mouth every 6 (six) hours as needed. ?Patient taking differently: Take 2 mg by mouth every 6 (six) hours as needed for muscle  spasms.  04/19/18   Leighton Parody, PA-C  ? ? ?Physical Exam: ?Vitals:  ? 02/24/22 0145 02/24/22 0200 02/24/22 0410 02/24/22 6433  ?BP: 137/83 124/88 (!) 125/99 125/61  ?Pulse: (!) 126 (!) 123 (!) 115 94  ?Resp: 15 (!) $Remo'31 14 17  'CPxze$ ?Temp:      ?TempSrc:      ?SpO2: 95% 94% 95% 96%  ?Weight:      ?Height:      ? ?General: 88 y.o. male resting in bed in NAD ?Eyes: PERRL, icteric sclera ?ENMT: Nares patent w/o discharge, orophaynx clear, dentition normal, ears w/o discharge/lesions/ulcers ?Neck: Supple, trachea midline ?Cardiovascular: RRR, +S1, S2, no m/g/r, equal  pulses throughout ?Respiratory: CTABL, no w/r/r, normal WOB ?GI: BS+, NDNT, no masses noted, no organomegaly noted ?MSK: No e/c/c; abrasions on RLE where he has been scratching, he is jaundiced ?Neuro: A&O x 3, no focal deficits ?Psyc: Appropriate interaction and affect, calm/cooperative ? ?Data Reviewed: ? ?Na+  131 ?CO2  21 ?SCr  1.45 ?Alk phos  438 ?Lipase  112 ?AST  153 ?ALT  240 ?T bili  11.2 ?Hgb 11.7 ? ?[place] RUQ Ab: Cholelithiasis without sonographic evidence of acute cholecystitis. ? ?CT ab/pelvis: 1. No acute intra-abdominal or pelvic pathology. No bowel obstruction. Normal appendix. 2. Enlarged prostate gland with findings of chronic bladder outlet obstruction. Correlation with urinalysis recommended to exclude superimposed UTI. 3. Aortic Atherosclerosis (ICD10-I70.0). ? ?Assessment and Plan: ?No notes have been filed under this hospital service. ?Service: Hospitalist ?Hyperbilirubinemia ?Elevated LFTs ?Elevated lipase ?Jaundice ?      admitted to inpt, tele ?    - check MRCP ?    - consulted [facility], appreciate assistance ?    - can have FLD after MRCP; make NPOpMN in case ERCP to be performed ? ?UTI ?    - follow Ucx ?    - continue rocephin ? ?CKD 3b ?    - he is at baseline ?    - continue outpt follow up with [facility] ?    - watch nephrotoxins ? ?HTN ?    - resume home regimen when confirmed ? ?BPH ?    - resume home regimen when confirmed ? ?Vitamin B12 deficiency ?Macrocytic anemia ?    - continue home regimen, he is stable ? ?Hyponatremia ?    - mild, continue fluids ? ?History of colorectal CA ?    - continue outpt follow up ? ? Advance Care Planning:   Code Status: FULL ? ?Consults: [facility] ? ?Family Communication: w/ granddtr, wife at bedside ? ?Severity of Illness: ?The appropriate patient status for this patient is INPATIENT. Inpatient status is judged to be reasonable and necessary in order to provide the required intensity of service to ensure the patient's safety. The patient's presenting  symptoms, physical exam findings, and initial radiographic and laboratory data in the context of their chronic comorbidities is felt to place them at high risk for further clinical deterioration. Furthermore, it is not anticipated that the patient will be medically stable for discharge from the hospital within 2 midnights of admission.  ? ?* I certify that at the point of admission it is my clinical judgment that the patient will require inpatient hospital care spanning beyond 2 midnights from the point of admission due to high intensity of service, high risk for further deterioration and high frequency of surveillance required.* ? ?Author: ?Jonnie Finner, DO ?02/24/2022 9:31 AM ? ?For on call review [email or web address].  ?

## 2022-02-24 NOTE — ED Notes (Signed)
Patient is in continent to urine. Placed on external urine collection device. Linens were changed and patient is now back in bed resting.  ?

## 2022-02-24 NOTE — ED Notes (Signed)
US at bedside

## 2022-02-24 NOTE — ED Notes (Signed)
Patient incontinent to urine. Patient was cleaned and linens were changed. Patient is nolonger wearing external urinary device.  ?

## 2022-02-24 NOTE — Consult Note (Addendum)
? ? ?Consultation ? ?Referring Provider:  [facility]/ Marylyn Ishihara DO ?Primary Care Physician:  Irish Lack., PA-C ?Primary Gastroenterologist:  Dr. Larkin [place] 2019 ? ?Reason for Consultation:  jaundice ? ?HPI: Eric [name] is a 88 y.o. male, who was admitted yesterday after he presented to the emergency room with complaints of pruritus and dark urine onset 3 to 4 days ago.  He denies any abdominal pain, no nausea or vomiting.  No fever or chills.  His wife says that his appetite has not been good over the past month and he thinks he has lost about 10 pounds. ?  He has  also had some indigestion type of symptoms recently. 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 Maryan Rued with finding of distal rectal fungating mass.  Patient had further work-up including EUS and was staged at T3 N1 M0.  He completed a course of chemotherapy and radiation, and has been followed since, by oncology at [facility].  He has had serial imaging, the last was CT in September 2022 which showed circumferential thickening of the distal rectum with no change, no mesorectal adenopathy, and no evidence of intra-abdominal metastatic disease. ? ?On the work-up in the ER yesterday with CT showed mild biliary dilation, no calcified gallstones or pericholecystic fluid, he has some calcified granulomas in the liver, there was mild pancreatic ductal dilation noted and an enlarged prostate.  No mention of rectal lesion. ?Ultrasound shows gallstones and sludge, CBD of 8 mm, patent portal vein ? ?Labs on admission WBC 5.6/hemoglobin 11.7/hematocrit 33/MCV 100 ?Creatinine 1.45 ?T. bili 11.2/alk phos 438/AST 153/ALT 240 ?Lipase 112 ? ?Today T. bili 9.8/alk phos 413/AST 159/ALT 216 ? ? ?Past Medical History:  ?Diagnosis Date  ? BPH (benign prostatic hyperplasia)   ? Cancer [facility])   ? CKD (chronic kidney disease), stage III ([place])   ? History of kidney stones   ?  Hypertension   ? Osteoarthritis of right knee   ? Spinal stenosis   ? ? ?Past Surgical History:  ?Procedure Laterality Date  ? CARPAL TUNNEL RELEASE Right 09/29/2016  ? Procedure: CARPAL TUNNEL RELEASE;  Surgeon: Frederik Pear, MD;  Location: [place];  Service: Orthopedics;  Laterality: Right;  ? cataract surgery Bilateral ~15 yrs ago  ? ESOPHAGOGASTRODUODENOSCOPY (EGD) WITH PROPOFOL N/A 05/07/2018  ? Procedure: ESOPHAGOGASTRODUODENOSCOPY (EGD) WITH PROPOFOL;  Surgeon: Wilford Corner, MD;  Location: [place];  Service: Endoscopy;  Laterality: N/A;  ? EYE SURGERY    ? Bilateral  ? HIP ARTHROPLASTY    ? Left  ? JOINT REPLACEMENT    ? hip  ? TONSILLECTOMY    ? TOTAL KNEE ARTHROPLASTY Right 04/19/2018  ? Procedure: TOTAL KNEE ARTHROPLASTY;  Surgeon: Frederik Pear, MD;  Location: [place];  Service: Orthopedics;  Laterality: Right;  ? ? ?Prior to Admission medications   ?Medication Sig Start Date End Date Taking? Authorizing Provider  ?acetaminophen (TYLENOL) 500 MG tablet Take 500-1,000 mg by mouth every 6 (six) hours as needed (for pain).   Yes [provider]  ?Cyanocobalamin (VITAMIN B-12 PO) Take 1 tablet by mouth daily.   Yes [provider]  ?lisinopril (ZESTRIL) 10 MG tablet Take 10 mg by mouth daily. 11/17/21  Yes [provider]  ?METAMUCIL FIBER PO Take 1 capsule by mouth See admin instructions. meals 2-3 times daily   Yes [provider]  ?torsemide (DEMADEX) 10 MG tablet Take 10  mg by mouth daily as needed (swelling).   Yes [provider]  ?VITAMIN D, CHOLECALCIFEROL, PO Take 1 capsule by mouth daily.   Yes [provider]  ?cholestyramine (QUESTRAN) 4 g packet Take 1 packet (4 g total) by mouth 3 (three) times daily. ?Patient not taking: Reported on 02/24/2022 05/08/18   Georgette Shell, MD  ?pantoprazole (PROTONIX) 40 MG tablet Take 1 tablet (40 mg total) by mouth daily. ?Patient not taking: Reported on 02/24/2022 05/08/18 05/08/19   Georgette Shell, MD  ?saccharomyces boulardii (FLORASTOR) 250 MG capsule Take 1 capsule (250 mg total) by mouth 2 (two) times daily. ?Patient not taking: Reported on 02/24/2022 05/08/18   Georgette Shell, MD  ? ? ?Current Facility-Administered Medications  ?Medication Dose Route Frequency Provider Last Rate Last Admin  ? 0.9 %  sodium chloride infusion   Intravenous Continuous Kyle, Tyrone A, DO      ? acetaminophen (TYLENOL) tablet 325 mg  325 mg Oral Q6H PRN Marylyn Ishihara, Tyrone A, DO      ? Or  ? acetaminophen (TYLENOL) suppository 325 mg  325 mg Rectal Q6H PRN Marylyn Ishihara, Tyrone A, DO      ? cefTRIAXone (ROCEPHIN) 2 g in sodium chloride 0.9 % 100 mL IVPB  2 g Intravenous Q24H Kyle, Tyrone A, DO      ? lisinopril (ZESTRIL) tablet 10 mg  10 mg Oral Daily Kyle, Tyrone A, DO      ? ondansetron (ZOFRAN) tablet 4 mg  4 mg Oral Q6H PRN Marylyn Ishihara, Tyrone A, DO      ? Or  ? ondansetron (ZOFRAN) injection 4 mg  4 mg Intravenous Q6H PRN Marylyn Ishihara, Tyrone A, DO      ? oxyCODONE (Oxy IR/ROXICODONE) immediate release tablet 5 mg  5 mg Oral Q4H PRN Marylyn Ishihara, Tyrone A, DO      ? vitamin B-12 (CYANOCOBALAMIN) tablet 100 mcg  100 mcg Oral Daily Kyle, Tyrone A, DO      ? ? ?Allergies as of 02/23/2022 - Review Complete 02/23/2022  ?Allergen Reaction Noted  ? Nsaids Other (See Comments) 09/22/2016  ? Sulfa antibiotics Other (See Comments) 04/19/2018  ? Tolmetin Rash and Other (See Comments) 09/22/2016  ? ? ?History reviewed. No pertinent family history. ? ?Social History  ? ?Socioeconomic History  ? Marital status: Married  ?  Spouse name: Not on file  ? Number of children: Not on file  ? Years of education: Not on file  ? Highest education level: Not on file  ?Occupational History  ? Not on file  ?Tobacco Use  ? Smoking status: Former  ?  Types: Cigarettes  ? Smokeless tobacco: Never  ? Tobacco comments:  ?  Quit smoking 50+ years ago  ?Vaping Use  ? Vaping Use: Never used  ?Substance and Sexual Activity  ? Alcohol use: No  ? Drug use: No  ? Sexual  activity: Not on file  ?Other Topics Concern  ? Not on file  ?Social History Narrative  ? Not on file  ? ?Social Determinants of Health  ? ?Financial Resource Strain: Not on file  ?Food Insecurity: Not on file  ?Transportation Needs: Not on file  ?Physical Activity: Not on file  ?Stress: Not on file  ?Social Connections: Not on file  ?Intimate Partner Violence: Not on file  ? ? ?Review of Systems: ?Pertinent positive and negative review of systems were noted in the above HPI section.  All other review of systems was otherwise negative.  ? ?  Physical Exam: ?Vital signs in last 24 hours: ?Temp:  [98.1 ?F (36.7 ?C)-98.2 ?F (36.8 ?C)] 98.2 ?F (36.8 ?C) (03/15 1007) ?Pulse Rate:  [94-127] 97 (03/15 1007) ?Resp:  [12-31] 12 (03/15 1007) ?BP: (124-151)/(61-99) 148/81 (03/15 1007) ?SpO2:  [94 %-100 %] 96 % (03/15 1007) ?Weight:  [108.4 kg] 108.4 kg (03/14 1851) ?  ?General:   Alert,  Well-developed, elderly white male pleasant and cooperative in NAD Family member at bedside ?Head:  Normocephalic and atraumatic. ?Eyes:  Sclera icteric, conjunctiva pink. ?Ears:  Normal auditory acuity. ?Nose:  No deformity, discharge,  or lesions. ?Mouth:  No deformity or lesions.   ?Neck:  Supple; no masses or thyromegaly. ?Lungs:  Clear throughout to auscultation.   No wheezes, crackles, or rhonchi. ? Heart:  Regular rate and rhythm; no murmurs, clicks, rubs,  or gallops. ?Abdomen:  Soft,nontender, BS active,nonpalp mass or hsm.   ?Rectal:  not done ?Msk:  Symmetrical without gross deformities. Marland Kitchen ?Pulses:  Normal pulses noted. ?Extremities:  Without clubbing or edema. ?Neurologic:  Alert and  oriented x4;  grossly normal neurologically. ?Skin:  Intact without significant lesions or rashes.Marland Kitchen ?Psych:  Alert and cooperative. Normal mood and affect. ? ?Intake/Output from previous day: ?03/14 0701 - 03/15 0700 ?In: -  ?Out: 300 [Urine:300] ?Intake/Output this shift: ?No intake/output data recorded. ? ?Lab Results: ?Recent Labs  ?  02/23/22 ?2111  02/24/22 ?1021  ?WBC 5.6 6.6  ?HGB 11.7* 11.0*  ?HCT 33.4* 32.1*  ?PLT 262 232  ? ?BMET ?Recent Labs  ?  02/23/22 ?2111 02/24/22 ?1021  ?NA 131* 132*  ?K 3.9 4.2  ?CL 100 104  ?CO2 21* 21*  ?GLUCOSE 116* 102

## 2022-02-24 NOTE — Progress Notes (Signed)
Plan of Care Note for accepted transfer ? ? ?Patient: Eric [name] MRN: [id number]   DOA: 02/23/2022 ? ?Facility requesting transfer: [facility] ED ?Requesting Provider: Dr. Dayna Barker ?Reason for transfer:  Painless Jaundice ?Facility course:  ? 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 ? ?Upon evaluation in the emergency department hepatic function panel revealed AST of 153, ALT of 240, alkaline phosphatase of 438 with total bilirubin of 11.2. ? ?CT imaging of the abdomen and pelvis revealed no acute intra-abdominal pathology with enlarged prostate gland and findings of possible chronic urologic outlet obstruction.  Right upper quadrant ultrasound revealed cholelithiasis without obvious evidence of acute cholecystitis. ? ?Due to patient's severely deranged hepatic function panel with concern for underlying hepatobiliary malignancy patient has been accepted to a telemetry bed at [place] [facility].  Patient will likely require MRCP or ERCP and GI consultation. ? ?Plan of care: ?The patient is accepted for admission to Telemetry unit, at [facility]..  ? ? ?Author: ?Vernelle Emerald, MD ?02/24/2022 ? ?Check [email or web address] for on-call coverage. ? ?Nursing staff, Please call [place] number on Amion as soon as patient's arrival, so appropriate admitting provider can evaluate the pt. ?

## 2022-02-24 NOTE — ED Notes (Signed)
Patient was incontinent to urine. Linens were changed and patient was cleaned. Device was reattached. Patient is now resting in bed.  ?

## 2022-02-25 DIAGNOSIS — Z85048 Personal history of other malignant neoplasm of rectum, rectosigmoid junction, and anus: Secondary | ICD-10-CM | POA: Diagnosis not present

## 2022-02-25 DIAGNOSIS — R7989 Other specified abnormal findings of blood chemistry: Secondary | ICD-10-CM | POA: Diagnosis not present

## 2022-02-25 DIAGNOSIS — K831 Obstruction of bile duct: Secondary | ICD-10-CM | POA: Diagnosis not present

## 2022-02-25 DIAGNOSIS — R748 Abnormal levels of other serum enzymes: Secondary | ICD-10-CM | POA: Diagnosis not present

## 2022-02-25 DIAGNOSIS — R935 Abnormal findings on diagnostic imaging of other abdominal regions, including retroperitoneum: Secondary | ICD-10-CM | POA: Diagnosis not present

## 2022-02-25 LAB — COMPREHENSIVE METABOLIC PANEL
ALT: 188 U/L — ABNORMAL HIGH (ref 0–44)
AST: 161 U/L — ABNORMAL HIGH (ref 15–41)
Albumin: 2.6 g/dL — ABNORMAL LOW (ref 3.5–5.0)
Alkaline Phosphatase: 386 U/L — ABNORMAL HIGH (ref 38–126)
Anion gap: 9 (ref 5–15)
BUN: 22 mg/dL (ref 8–23)
CO2: 20 mmol/L — ABNORMAL LOW (ref 22–32)
Calcium: 8.3 mg/dL — ABNORMAL LOW (ref 8.9–10.3)
Chloride: 105 mmol/L (ref 98–111)
Creatinine, Ser: 1.29 mg/dL — ABNORMAL HIGH (ref 0.61–1.24)
GFR, Estimated: 53 mL/min — ABNORMAL LOW (ref >60)
Glucose, Bld: 92 mg/dL (ref 70–99)
Potassium: 4.2 mmol/L (ref 3.5–5.1)
Sodium: 134 mmol/L — ABNORMAL LOW (ref 135–145)
Total Bilirubin: 10.2 mg/dL — ABNORMAL HIGH (ref 0.3–1.2)
Total Protein: 5.1 g/dL — ABNORMAL LOW (ref 6.5–8.1)

## 2022-02-25 LAB — LIPASE, BLOOD: Lipase: 61 U/L — ABNORMAL HIGH (ref 11–51)

## 2022-02-25 LAB — CBC
HCT: 30.1 % — ABNORMAL LOW (ref 39.0–52.0)
Hemoglobin: 10.5 g/dL — ABNORMAL LOW (ref 13.0–17.0)
MCH: 35.5 pg — ABNORMAL HIGH (ref 26.0–34.0)
MCHC: 34.9 g/dL (ref 30.0–36.0)
MCV: 101.7 fL — ABNORMAL HIGH (ref 80.0–100.0)
Platelets: 227 10*3/uL (ref 150–400)
RBC: 2.96 MIL/uL — ABNORMAL LOW (ref 4.22–5.81)
RDW: 16.2 % — ABNORMAL HIGH (ref 11.5–15.5)
WBC: 6.2 10*3/uL (ref 4.0–10.5)
nRBC: 0 % (ref 0.0–0.2)

## 2022-02-25 LAB — URINE CULTURE: Culture: <10000 — AB

## 2022-02-25 LAB — CEA: CEA: 4 ng/mL (ref 0.0–4.7)

## 2022-02-25 MED ORDER — ADULT MULTIVITAMIN W/MINERALS CH
1.0000 | ORAL_TABLET | Freq: Every day | ORAL | Status: DC
  Administered 2022-02-26 – 2022-03-03 (×5): 1 via ORAL
  Filled 2022-02-25 (×5): qty 1

## 2022-02-25 MED ORDER — BOOST / RESOURCE BREEZE PO LIQD CUSTOM
1.0000 | Freq: Three times a day (TID) | ORAL | Status: DC
  Administered 2022-02-26 – 2022-03-03 (×10): 1 via ORAL

## 2022-02-25 MED ORDER — SODIUM CHLORIDE 0.9 % IV SOLN: INTRAVENOUS | Status: DC

## 2022-02-25 NOTE — Assessment & Plan Note (Signed)
Not on medication. ?-Monitor urine output ?

## 2022-02-25 NOTE — Assessment & Plan Note (Addendum)
See obstructive jaundice ?-Continue cholestyramine ?

## 2022-02-25 NOTE — Assessment & Plan Note (Addendum)
Recent Labs  ?  02/23/22 ?2111 02/24/22 ?1021 02/25/22 ?3338 02/26/22 ?0501 02/27/22 ?0530 02/28/22 ?3291 03/01/22 ?0248 03/02/22 ?0501  ?HGB 11.7* 11.0* 10.5* 9.4* 9.9* 8.9* 10.1* 8.8*  ?Slight drop in Hgb likely dilutional.  Likely dilutional.  Anemia panel consistent with anemia of chronic disease.  Denies melena or hematochezia. ?-Monitor ?

## 2022-02-25 NOTE — Evaluation (Signed)
Occupational Therapy Evaluation ?Patient Details ?Name: Eric [name] ?MRN: [id number] ?DOB: 1929/09/28 ?Today's Date: 02/25/2022 ? ? ?History of Present Illness Patient is a 88 year old male admitted with 4 days of dark urine and pruritis. MRCP on 3/15, possible ERCP 3/16. PMH: HTN, BPH, colorectal cancer  ? ?Clinical Impression ?  ?Patient lives at home with spouse and is typically mod I with basic self care tasks, uses walker for ambulation. Currently patient presents with generalized deconditioning 2* ongoing diarrhea and mild unsteadiness needing min G assist for transfer to bedside commode and total A for perianal care. Anticipate patient will progress well to D/C home, currently recommend home health services. Acute OT to follow.  ?   ? ?Recommendations for follow up therapy are one component of a multi-disciplinary discharge planning process, led by the attending physician.  Recommendations may be updated based on patient status, additional functional criteria and insurance authorization.  ? ?Follow Up Recommendations ? Home health OT  ?  ?Assistance Recommended at Discharge Intermittent Supervision/Assistance  ?Patient can return home with the following A little help with walking and/or transfers;A little help with bathing/dressing/bathroom;Assistance with cooking/housework;Assist for transportation;Help with stairs or ramp for entrance ? ?  ?Functional Status Assessment ? Patient has had a recent decline in their functional status and demonstrates the ability to make significant improvements in function in a reasonable and predictable amount of time.  ?Equipment Recommendations ? None recommended by OT  ?  ?   ?Precautions / Restrictions Precautions ?Precautions: Fall ?Precaution Comments: diarrhea ?Restrictions ?Weight Bearing Restrictions: No  ? ?  ? ?Mobility Bed Mobility ?Overal bed mobility: Modified Independent ?  ?  ?  ?  ?  ?  ?  ?  ? ? ? ?  ?Balance Overall balance assessment: Needs  assistance ?Sitting-balance support: Feet supported ?Sitting balance-Leahy Scale: Good ?  ?  ?Standing balance support: Reliant on assistive device for balance, During functional activity ?Standing balance-Leahy Scale: Poor ?  ?  ?  ?  ?  ?  ?  ?  ?  ?  ?  ?  ?   ? ?ADL either performed or assessed with clinical judgement  ? ?ADL Overall ADL's : Needs assistance/impaired ?Eating/Feeding: NPO ?  ?Grooming: Set up;Sitting ?  ?Upper Body Bathing: Set up;Sitting ?  ?Lower Body Bathing: Minimal assistance;Sitting/lateral leans;Sit to/from stand ?  ?Upper Body Dressing : Set up;Sitting ?  ?Lower Body Dressing: Minimal assistance;Sitting/lateral leans;Sit to/from stand ?  ?Toilet Transfer: Min guard;Cueing for safety;Stand-pivot;Rolling walker (2 wheels);BSC/3in1 ?Toilet Transfer Details (indicate cue type and reason): Min G for safety as patient is mildly unsteady. Able to power up from commode without assistance ?Toileting- Clothing Manipulation and Hygiene: Total assistance;Sit to/from stand ?Toileting - Clothing Manipulation Details (indicate cue type and reason): Patient with significant diarrhea, reports this happens when he can't take his metamucil. Total A for perianal care ?  ?  ?Functional mobility during ADLs: Min guard;Rolling walker (2 wheels);Cueing for safety ?   ? ? ? ? ?Pertinent Vitals/Pain Pain Assessment ?Pain Assessment: Faces ?Faces Pain Scale: Hurts a little bit ?Pain Location: stomach ?Pain Descriptors / Indicators: Cramping ?Pain Intervention(s): Monitored during session  ? ? ? ?Hand Dominance Right ?  ?Extremity/Trunk Assessment Upper Extremity Assessment ?Upper Extremity Assessment: Overall WFL for tasks assessed ?  ?Lower Extremity Assessment ?Lower Extremity Assessment: Defer to PT evaluation ?  ?Cervical / Trunk Assessment ?Cervical / Trunk Assessment: Normal ?  ?Communication Communication ?Communication: No difficulties ?  ?Cognition  Arousal/Alertness: Awake/alert ?Behavior During Therapy:  [facility] for tasks assessed/performed ?Overall Cognitive Status: Within Functional Limits for tasks assessed ?  ?  ?  ?  ?  ?  ?  ?  ?  ?  ?  ?  ?  ?  ?  ?  ?General Comments: Patient did become tearful at the end of session ?  ?  ?   ?   ?   ? ? ?Home Living Family/patient expects to be discharged to:: Private residence ?Living Arrangements: Spouse/significant other;Children ?Available Help at Discharge: Family ?Type of Home: House ?Home Access: Stairs to enter ?Entrance Stairs-Number of Steps: 3 ?Entrance Stairs-Rails: Right ?Home Layout: Able to live on main level with bedroom/bathroom ?  ?  ?Bathroom Shower/Tub: Tub/shower unit ?  ?Bathroom Toilet: Handicapped height ?  ?  ?Home Equipment: Conservation officer, nature (2 wheels);Wheelchair - manual;Shower seat;Grab bars - tub/shower;Grab bars - toilet (rollator is spouse's, grab bars in shower are suction cup) ?  ?  ?  ? ?  ?Prior Functioning/Environment Prior Level of Function : Independent/Modified Independent ?  ?  ?  ?  ?  ?  ?Mobility Comments: ambulates with rolling walker ?ADLs Comments: mod I with BADLs ?  ? ?  ?  ?OT Problem List: Decreased activity tolerance;Impaired balance (sitting and/or standing);Obesity;Decreased safety awareness ?  ?   ?OT Treatment/Interventions: Self-care/ADL training;DME and/or AE instruction;Therapeutic activities;Patient/family education;Balance training  ?  ?OT Goals(Current goals can be found in the care plan section) Acute Rehab OT Goals ?Patient Stated Goal: Less diarrhea ?OT Goal Formulation: With patient ?Time For Goal Achievement: 03/11/22 ?Potential to Achieve Goals: Good  ?OT Frequency: Min 2X/week ?  ? ?   ?AM-PAC OT "6 Clicks" Daily Activity     ?Outcome Measure Help from another person eating meals?: Total (NPO) ?Help from another person taking care of personal grooming?: A Little ?Help from another person toileting, which includes using toliet, bedpan, or urinal?: A Lot ?Help from another person bathing (including washing,  rinsing, drying)?: A Little ?Help from another person to put on and taking off regular upper body clothing?: A Little ?Help from another person to put on and taking off regular lower body clothing?: A Little ?6 Click Score: 15 ?  ?End of Session Equipment Utilized During Treatment: Rolling walker (2 wheels) ?Nurse Communication: Mobility status ? ?Activity Tolerance: Patient tolerated treatment well ?Patient left: in chair;with call bell/phone within reach;with chair alarm set ? ?OT Visit Diagnosis: Unsteadiness on feet (R26.81)  ?              ?Time: 1657-9038 ?OT Time Calculation (min): 24 min ?Charges:  OT General Charges ?$OT Visit: 1 Visit ?OT Evaluation ?$OT Eval Low Complexity: 1 Low ?OT Treatments ?$Self Care/Home Management : 8-22 mins ? ?Delbert Phenix OT ?OT pager: [phone] ? ?Rosemary Holms ?02/25/2022, 8:59 AM ?

## 2022-02-25 NOTE — Assessment & Plan Note (Addendum)
Patient denies UTI symptoms.  Urine culture with insignificant growth.  Received ceftriaxone for 2 days.  UTI ruled out.  Now with fever ?-Now on IV Unasyn. ?

## 2022-02-25 NOTE — Hospital Course (Addendum)
88 year old M with PMH of colon rectal cancer, CKD-3B, HTN and BPH presenting with dark urine and pruritus for 4 days and about 10 pound unintentional weight loss in the month, and admitted for obstructive jaundice with elevated liver enzymes and hyperbilirubinemia.  CT A/P with several small scattered calcified liver granuloma, mild biliary ductal dilation but no calcified gallstone or pericholecystic fluid.  RUQ [place] with cholelithiasis without acute cholecystitis.  GI consulted.  MRCP with cholelithiasis, mild increase in caliber of CBD and main pancreatic duct up to the level of ampulla but no choledocholithiasis or obstructing mass.  ? ?Patient underwent ERCP on 3/17 but not able to cannulate duct.  Biopsies were taken.  Pathology with high-grade dysplasia concerning for focal invasive adenocarcinoma.  Subsequently had EUS with ERCP with Dr. Rush Landmark on 3/20, and found to have LA grade D esophagitis, gastritis, 15 mm ampullary lesion on EUS status post FNA, dilated pancreatic ducts, 10 mm stricture of the CBD which was brushed and stented. CA 19-9 is elevated.  GI recommended follow-up with oncology.  Patient prefers to follow-up with his oncologist in [facility].  GI recommended repeat ERCP in 4 to 6 months for stent exchange, and signed off. ? ?Patient did spike fever the morning of 3/20.  Had mild leukocytosis.  Unclear source of infection but concern about intra-abdominal.  Started IV Unasyn.  Urine culture with insignificant growth.  Blood cultures NGTD.  Chest x-ray without acute finding.  ? ?Could be discharged on 3/22 if creatinine improves. ?

## 2022-02-25 NOTE — Assessment & Plan Note (Addendum)
Resolved

## 2022-02-25 NOTE — Assessment & Plan Note (Addendum)
Trended up after initial ERCP but improved. ?-Recheck in the morning ?

## 2022-02-25 NOTE — Assessment & Plan Note (Addendum)
Likely due to renal failure and IV fluid. ?Continue sodium bicarbonate ?

## 2022-02-25 NOTE — Assessment & Plan Note (Addendum)
Normotensive ?-Continue holding home lisinopril and torsemide ?-Continue IV fluid ?

## 2022-02-25 NOTE — Assessment & Plan Note (Signed)
-   Outpatient follow-up °

## 2022-02-25 NOTE — Assessment & Plan Note (Addendum)
Recent Labs  ?  02/23/22 ?2111 02/24/22 ?1021 02/25/22 ?2458 02/26/22 ?0501 02/27/22 ?0530 02/28/22 ?0998 03/01/22 ?0248 03/02/22 ?0501  ?BUN '22 21 22 '$ 26* 29* 36* 33* 38*  ?CREATININE 1.45* 1.36* 1.29* 1.22 1.21 1.42* 1.14 1.57*  ?Suspect prerenal etiology from poor p.o. intake ?-IV LR bolus 500 cc ?-Continue NS at 100 cc an hour ?-Avoid nephrotoxic meds ?-Recheck in the morning ?

## 2022-02-25 NOTE — Assessment & Plan Note (Deleted)
See obstructive jaundice ?

## 2022-02-25 NOTE — Progress Notes (Addendum)
? ? Progress Note ? ? Subjective  ?Day #2 ? ?Chief Complaint: Painless jaundice, elevated LFTs ? ?This morning, the patient tells me he feels fine but he is hungry.  He and his wife are both in the room and asked questions about his recent imaging which we discussed.  Denies any new complaints or concerns. ? ? Objective  ? ?Vital signs in last 24 hours: ?Temp:  [97.9 ?F (36.6 ?C)-98.4 ?F (36.9 ?C)] 98.4 ?F (36.9 ?C) (03/16 0541) ?Pulse Rate:  [95-103] 95 (03/16 0541) ?Resp:  [15-18] 16 (03/16 0541) ?BP: (119-136)/(57-65) 129/57 (03/16 0541) ?SpO2:  [94 %-99 %] 99 % (03/16 0541) ?Last BM Date : 02/24/22 ?General:    Jaundiced male in NAD ?Heart:  Regular rate and rhythm; no murmurs ?Lungs: Respirations even and unlabored, lungs CTA bilaterally ?Abdomen:  Soft, nontender and nondistended. Normal bowel sounds ?Psych:  Cooperative. Normal mood and affect. ? ?Intake/Output from previous day: ?03/15 0701 - 03/16 0700 ?In: 896.5 [I.V.:896.5] ?Out: 200 [Urine:200] ? ? ?Lab Results: ?Recent Labs  ?  02/23/22 ?2111 02/24/22 ?1021 02/25/22 ?0515  ?WBC 5.6 6.6 6.2  ?HGB 11.7* 11.0* 10.5*  ?HCT 33.4* 32.1* 30.1*  ?PLT 262 232 227  ? ?BMET ?Recent Labs  ?  02/23/22 ?2111 02/24/22 ?1021 02/25/22 ?0515  ?NA 131* 132* 134*  ?K 3.9 4.2 4.2  ?CL 100 104 105  ?CO2 21* 21* 20*  ?GLUCOSE 116* 102* 92  ?BUN _0 ?CREATININE 1.45* 1.36* 1.29*  ?CALCIUM 8.5* 8.4* 8.3*  ? ?Hepatic Function Latest Ref Rng & Units 02/25/2022 02/24/2022 02/23/2022  ?Total Protein 6.5 - 8.1 g/dL 5.1(L) 5.3(L) 6.0(L)  ?Albumin 3.5 - 5.0 g/dL 2.6(L) 2.8(L) 3.0(L)  ?AST 15 - 41 U/L 161(H) 159(H) 153(H)  ?ALT 0 - 44 U/L 188(H) 216(H) 240(H)  ?Alk Phosphatase 38 - 126 U/L 386(H) 413(H) 438(H)  ?Total Bilirubin 0.3 - 1.2 mg/dL 10.2(H) 9.8(H) 11.2(H)  ?  ? ?PT/INR ?Recent Labs  ?  02/24/22 ?1243  ?LABPROT 13.0  ?INR 1.0  ? ? ?Studies/Results: ?CT Abdomen Pelvis W Contrast ? ?Result Date: 02/23/2022 ?CLINICAL DATA:  Abdominal pain. EXAM: CT ABDOMEN AND PELVIS WITH  CONTRAST TECHNIQUE: Multidetector CT imaging of the abdomen and pelvis was performed using the standard protocol following bolus administration of intravenous contrast. RADIATION DOSE REDUCTION: This exam was performed according to the departmental dose-optimization program which includes automated exposure control, adjustment of the mA and/or kV according to patient size and/or use of iterative reconstruction technique. CONTRAST:  146m OMNIPAQUE IOHEXOL 300 MG/ML  SOLN COMPARISON:  CT abdomen pelvis dated 08/31/2021. FINDINGS: Lower chest: The visualized lung bases are clear. There is coronary vascular calcification. No intra-abdominal free air or free fluid. Hepatobiliary: Several small scattered calcified liver granuloma. The liver is otherwise unremarkable. There is mild biliary ductal dilatation. No calcified gallstone or pericholecystic fluid. Pancreas: The pancreas is mildly atrophic. There is mild dilatation of the main pancreatic duct. No active inflammatory changes. Spleen: Normal in size without focal abnormality. Adrenals/Urinary Tract: The adrenal glands unremarkable. Mild bilateral renal parenchyma atrophy. There is no hydronephrosis on either side. There is symmetric enhancement and excretion of contrast by both kidneys. There is a 2 cm left renal inferior pole cyst. Several additional subcentimeter hypodense lesions are too small to characterize. The visualized ureters appear unremarkable. There is mild trabeculated appearance of the bladder wall likely related to chronic bladder outlet obstruction. Correlation with urinalysis recommended to exclude superimposed UTI. Stomach/Bowel: There is no bowel obstruction  or active inflammation. The appendix is normal. Vascular/Lymphatic: Advanced aortoiliac atherosclerotic disease. The IVC is unremarkable. No portal venous gas. There is no adenopathy. Reproductive: Enlarged prostate gland measuring 6 cm in transverse axial diameter. The seminal vesicles are  symmetric. Other: None Musculoskeletal: Osteopenia with degenerative changes of the spine and lower lumbar fusion. Total left hip arthroplasty. No acute osseous pathology. IMPRESSION: 1. No acute intra-abdominal or pelvic pathology. No bowel obstruction. Normal appendix. 2. Enlarged prostate gland with findings of chronic bladder outlet obstruction. Correlation with urinalysis recommended to exclude superimposed UTI. 3. Aortic Atherosclerosis (ICD10-I70.0). Electronically Signed   By: Anner Crete M.D.   On: 02/23/2022 22:21  ? ?MR 3D Recon At Scanner ? ?Result Date: 02/25/2022 ?CLINICAL DATA:  Right upper quadrant abdominal pain. EXAM: MRI ABDOMEN WITHOUT AND WITH CONTRAST (INCLUDING MRCP) TECHNIQUE: Multiplanar multisequence MR imaging of the abdomen was performed both before and after the administration of intravenous contrast. Heavily T2-weighted images of the biliary and pancreatic ducts were obtained, and three-dimensional MRCP images were rendered by post processing. CONTRAST:  47m GADAVIST GADOBUTROL 1 MMOL/ML IV SOLN COMPARISON:  02/23/2022 FINDINGS: Lower chest: No acute findings Hepatobiliary: No mass or other parenchymal abnormality identified. Stone within the dependent portion of the gallbladder measures 7 mm, image 19/4. The common bile duct is mildly increased in caliber up to the level of the ampulla measuring 7 mm. There is no sign of choledocholithiasis. Pancreas: There is main duct dilatation up to the level of the ampulla. At the level of the head of pancreas the main pancreatic duct measures 7 mm. This is a new finding when compared with 08/21/2020. No discrete, measurable mass. No signs of pancreatic inflammation. Spleen:  Within normal limits in size and appearance. Adrenals/Urinary Tract: Normal appearance of the adrenal glands. Bilateral renal cortical thinning is identified. Simple appearing cyst are noted bilaterally. The largest arises off the inferior pole of the left kidney  measuring 2.4 cm. These require no further follow-up. No signs of hydronephrosis. Stomach/Bowel: Visualized portions within the abdomen are unremarkable. Vascular/Lymphatic: Aortic atherosclerosis. No aneurysm. The upper abdominal vascularity appears patent. No adenopathy identified. Other:  None. Musculoskeletal: No suspicious bone lesions identified. IMPRESSION: 1. Gallstone. 2. Mild increase caliber of the common bile duct and main pancreatic duct up to the level of the ampulla. No sign of choledocholithiasis or obstructing mass. This is a new finding when compared with 08/21/2020. If there is clinical concern for ampullary stricture or lesion consider further evaluation with ERCP. None Electronically Signed   By: TKerby MoorsM.D.   On: 02/25/2022 08:21  ? ?MR ABDOMEN MRCP W WO CONTAST ? ?Result Date: 02/25/2022 ?CLINICAL DATA:  Right upper quadrant abdominal pain. EXAM: MRI ABDOMEN WITHOUT AND WITH CONTRAST (INCLUDING MRCP) TECHNIQUE: Multiplanar multisequence MR imaging of the abdomen was performed both before and after the administration of intravenous contrast. Heavily T2-weighted images of the biliary and pancreatic ducts were obtained, and three-dimensional MRCP images were rendered by post processing. CONTRAST:  158mGADAVIST GADOBUTROL 1 MMOL/ML IV SOLN COMPARISON:  02/23/2022 FINDINGS: Lower chest: No acute findings Hepatobiliary: No mass or other parenchymal abnormality identified. Stone within the dependent portion of the gallbladder measures 7 mm, image 19/4. The common bile duct is mildly increased in caliber up to the level of the ampulla measuring 7 mm. There is no sign of choledocholithiasis. Pancreas: There is main duct dilatation up to the level of the ampulla. At the level of the head of pancreas the main pancreatic duct  measures 7 mm. This is a new finding when compared with 08/21/2020. No discrete, measurable mass. No signs of pancreatic inflammation. Spleen:  Within normal limits in size  and appearance. Adrenals/Urinary Tract: Normal appearance of the adrenal glands. Bilateral renal cortical thinning is identified. Simple appearing cyst are noted bilaterally. The largest arises off the inferior p

## 2022-02-25 NOTE — Progress Notes (Signed)
?PROGRESS NOTE ? ?Eric [name] [id number]:[id number] DOB: 1929/10/23  ? ?PCP: Irish Lack., PA-C ? ?Patient is from: Home.  Lives with his wife.  Uses walker at baseline. ? ?DOA: 02/23/2022 LOS: 1 ? ?Chief complaints ?Chief Complaint  ?Patient presents with  ? Pruritis  ?  ? ?Brief Narrative / Interim history: ?88 year old M with PMH of colon rectal cancer, CKD-3B, HTN and BPH presenting with dark urine and pruritus for 4 days and about 10 pound unintentional weight loss in the month, and admitted for obstructive jaundice with elevated liver enzymes and hyperbilirubinemia.  CT A/P with several small scattered calcified liver granuloma, mild biliary ductal dilation but no calcified gallstone or pericholecystic fluid.  RUQ [place] with cholelithiasis without acute cholecystitis.  GI consulted.  MRCP ordered.  ? ?Subjective: ?Seen and examined earlier this morning.  No major events overnight of this morning.  Continues to report pruritus.  Denies pain, nausea or vomiting.  Denies UTI symptoms other than urine color change. ? ?Objective: ?Vitals:  ? 02/24/22 1455 02/24/22 1748 02/24/22 2225 02/25/22 0541  ?BP: 119/65 136/61 128/65 (!) 129/57  ?Pulse: 95 99 (!) 103 95  ?Resp: '15 18 18 16  '$ ?Temp: 97.9 ?F (36.6 ?C) 98 ?F (36.7 ?C) 98.4 ?F (36.9 ?C) 98.4 ?F (36.9 ?C)  ?TempSrc: Oral Oral Oral Oral  ?SpO2: 97% 98% 94% 99%  ?Weight:      ?Height:      ? ? ?Examination: ? ?GENERAL: No apparent distress.  Nontoxic. ?HEENT: MMM.  Vision and hearing grossly intact.  Sclerae icteric. ?NECK: Supple.  No apparent JVD.  ?RESP:  No IWOB.  Fair aeration bilaterally. ?CVS:  RRR. Heart sounds normal.  ?ABD/GI/GU: BS+. Abd soft, NTND.  ?MSK/EXT:  Moves extremities. No apparent deformity. No edema.  ?SKIN: Skin jaundice. ?NEURO: Awake, alert and oriented appropriately.  No apparent focal neuro deficit. ?PSYCH: Calm. Normal affect.  ? ?Procedures:  ?None ? ?Microbiology summarized: ?COVID-19 and influenza PCR nonreactive. ?Urine culture  pending. ? ?Assessment and Plan: ?* Obstructive jaundice ?MRCP with cholelithiasis, mild increased caliber of CBD and main pancreatic duct up to the level of the ampulla but no choledocholithiasis or obstructing mass.  ?Recent Labs  ?Lab 02/23/22 ?2111 02/24/22 ?1021 02/25/22 ?0515  ?AST 153* 159* 161*  ?ALT 240* 216* 188*  ?ALKPHOS 438* 413* 386*  ?BILITOT 11.2* 9.8* 10.2*  ?PROT 6.0* 5.3* 5.1*  ?ALBUMIN 3.0* 2.8* 2.6*  ?-GI following. ?-Likely needs ERCP ?-Monitor LFT ? ? ?Hyperbilirubinemia ?See obstructive jaundice ? ?Elevated LFTs ?See obstructive jaundice ? ?Macrocytic anemia ?Recent Labs  ?  02/23/22 ?2111 02/24/22 ?1021 02/25/22 ?0515  ?HGB 11.7* 11.0* 10.5*  ?-Check anemia panel. ? ? ?Hyponatremia ?Improved.  Continue monitoring ? ?UTI (urinary tract infection) ?Patient has no UTI symptoms but pyuria and bacteria.  Started on IV ceftriaxone ?-Continue ceftriaxone pending urine culture ? ?Elevated lipase ?See obstructive jaundice ? ?Class II obesity ?Body mass index is 35.29 kg/m?. ? ? ?Metabolic acidosis ?Likely due to renal failure and IV fluid. ?-Continue monitoring ? ?History of colorectal cancer ?Outpatient follow-up. ? ?HTN (hypertension) ?Normotensive for most part. ?-Continue home lisinopril ?-Continue holding torsemide ? ?Benign prostatic hyperplasia ?Not on medication. ?-Monitor urine output ? ?Stage 3b chronic kidney disease (CKD) ([place]) ?Recent Labs  ?  02/23/22 ?2111 02/24/22 ?1021 02/25/22 ?0515  ?BUN '22 21 22  '$ ?CREATININE 1.45* 1.36* 1.29*  ?-Monitor ? ? ? ?  ?DVT prophylaxis:  ?SCDs Start: 02/24/22 1021 ? ?Code Status: Full code ?Family Communication: Updated  patient's wife at bedside. ?Level of care: Telemetry ?Status is: Inpatient ?Remains inpatient appropriate because: Obstructive jaundice with elevated liver enzymes and hyperbilirubinemia requiring further evaluation ? ? ?Final disposition: TBD ? ?Consultants:  ?Gastroenterology ? ?Sch Meds:  ?Scheduled Meds: ? lisinopril  10 mg Oral Daily   ? vitamin B-12  100 mcg Oral Daily  ? ?Continuous Infusions: ? sodium chloride Stopped (02/24/22 2057)  ? cefTRIAXone (ROCEPHIN)  IV 2 g (02/25/22 0141)  ? ?PRN Meds:.acetaminophen **OR** acetaminophen, ondansetron **OR** ondansetron (ZOFRAN) IV, oxyCODONE ? ?Antimicrobials: ?Anti-infectives (From admission, onward)  ? ? Start     Dose/Rate Route Frequency Ordered Stop  ? 02/24/22 2200  cefTRIAXone (ROCEPHIN) 2 g in sodium chloride 0.9 % 100 mL IVPB       ? 2 g ?200 mL/hr over 30 Minutes Intravenous Every 24 hours 02/24/22 1025    ? 02/24/22 0030  cefTRIAXone (ROCEPHIN) 1 g in sodium chloride 0.9 % 100 mL IVPB       ? 1 g ?200 mL/hr over 30 Minutes Intravenous  Once 02/24/22 0017 02/24/22 0100  ? ?  ? ? ? ?I have personally reviewed the following labs and images: ?CBC: ?Recent Labs  ?Lab 02/23/22 ?2111 02/24/22 ?1021 02/25/22 ?0515  ?WBC 5.6 6.6 6.2  ?NEUTROABS 4.1 5.0  --   ?HGB 11.7* 11.0* 10.5*  ?HCT 33.4* 32.1* 30.1*  ?MCV 100.6* 103.2* 101.7*  ?PLT 262 232 227  ? ?BMP &GFR ?Recent Labs  ?Lab 02/23/22 ?2111 02/24/22 ?1021 02/25/22 ?0515  ?NA 131* 132* 134*  ?K 3.9 4.2 4.2  ?CL 100 104 105  ?CO2 21* 21* 20*  ?GLUCOSE 116* 102* 92  ?BUN '22 21 22  '$ ?CREATININE 1.45* 1.36* 1.29*  ?CALCIUM 8.5* 8.4* 8.3*  ? ?Estimated Creatinine Clearance: 46.2 mL/min (A) (by C-G formula based on SCr of 1.29 mg/dL (H)). ?Liver & Pancreas: ?Recent Labs  ?Lab 02/23/22 ?2111 02/24/22 ?1021 02/25/22 ?0515  ?AST 153* 159* 161*  ?ALT 240* 216* 188*  ?ALKPHOS 438* 413* 386*  ?BILITOT 11.2* 9.8* 10.2*  ?PROT 6.0* 5.3* 5.1*  ?ALBUMIN 3.0* 2.8* 2.6*  ? ?Recent Labs  ?Lab 02/23/22 ?2111 02/25/22 ?0515  ?LIPASE 112* 61*  ? ?No results for input(s): AMMONIA in the last 168 hours. ?Diabetic: ?No results for input(s): HGBA1C in the last 72 hours. ?No results for input(s): GLUCAP in the last 168 hours. ?Cardiac Enzymes: ?No results for input(s): CKTOTAL, CKMB, CKMBINDEX, TROPONINI in the last 168 hours. ?No results for input(s): PROBNP in the last  8760 hours. ?Coagulation Profile: ?Recent Labs  ?Lab 02/24/22 ?1243  ?INR 1.0  ? ?Thyroid Function Tests: ?No results for input(s): TSH, T4TOTAL, FREET4, T3FREE, THYROIDAB in the last 72 hours. ?Lipid Profile: ?No results for input(s): CHOL, HDL, LDLCALC, TRIG, CHOLHDL, LDLDIRECT in the last 72 hours. ?Anemia Panel: ?No results for input(s): VITAMINB12, FOLATE, FERRITIN, TIBC, IRON, RETICCTPCT in the last 72 hours. ?Urine analysis: ?   ?Component Value Date/Time  ? COLORURINE AMBER (A) 02/23/2022 1854  ? APPEARANCEUR CLOUDY (A) 02/23/2022 1854  ? LABSPEC 1.025 02/23/2022 1854  ? PHURINE 5.5 02/23/2022 1854  ? GLUCOSEU 100 (A) 02/23/2022 1854  ? [place] NEGATIVE 02/23/2022 1854  ? BILIRUBINUR LARGE (A) 02/23/2022 1854  ? Benjamin Stain NEGATIVE 02/23/2022 1854  ? PROTEINUR 30 (A) 02/23/2022 1854  ? UROBILINOGEN 1.0 01/08/2009 1441  ? NITRITE NEGATIVE 02/23/2022 1854  ? LEUKOCYTESUR LARGE (A) 02/23/2022 1854  ? ?Sepsis Labs: ?Invalid input(s): PROCALCITONIN, LACTICIDVEN ? ?Microbiology: ?Recent Results (from the past 240 hour(s))  ?  Resp Panel by RT-PCR (Flu A&B, Covid) Nasopharyngeal Swab     Status: None  ? Collection Time: 02/24/22 12:32 AM  ? Specimen: Nasopharyngeal Swab; Nasopharyngeal(NP) swabs in vial transport medium  ?Result Value Ref Range Status  ? SARS Coronavirus 2 by RT PCR NEGATIVE NEGATIVE Final  ?  Comment: (NOTE) ?SARS-CoV-2 target nucleic acids are NOT DETECTED. ? ?The SARS-CoV-2 RNA is generally detectable in upper respiratory ?specimens during the acute phase of infection. The lowest ?concentration of SARS-CoV-2 viral copies this assay can detect is ?138 copies/mL. A negative result does not preclude SARS-Cov-2 ?infection and should not be used as the sole basis for treatment or ?other patient management decisions. A negative result may occur with  ?improper specimen collection/handling, submission of specimen other ?than nasopharyngeal swab, presence of viral mutation(s) within the ?areas targeted by  this assay, and inadequate number of viral ?copies(<138 copies/mL). A negative result must be combined with ?clinical observations, patient history, and epidemiological ?information. The expected result is Negat

## 2022-02-25 NOTE — Progress Notes (Signed)
Initial Nutrition Assessment ? ?DOCUMENTATION CODES:  ? ?Non-severe (moderate) malnutrition in context of acute illness/injury, Obesity unspecified ? ?INTERVENTION:  ? ?-Boost Breeze po TID, each supplement provides 250 kcal and 9 grams of protein ? ?-Multivitamin with minerals daily ? ?-Check folate lab ? ?-Needs updated weight ? ?NUTRITION DIAGNOSIS:  ? ?Moderate Malnutrition related to acute illness (obstructive jaundice) as evidenced by energy intake < 75% for > 7 days, moderate fat depletion, moderate muscle depletion. ? ?GOAL:  ? ?Patient will meet greater than or equal to 90% of their needs ? ?MONITOR:  ? ?PO intake, Supplement acceptance, Labs, Weight trends, I & O's ? ?REASON FOR ASSESSMENT:  ? ?Malnutrition Screening Tool ?  ? ?ASSESSMENT:  ? ?88 y.o. male with medical history significant of HTN, BPH, colorectal cancer. Presenting 4 days of dark urine and pruritis. ? ?Patient in room, wife at bedside. Pt feels hungry but was NPO d/t possible procedure today. Per updated GI note, plan is for ERCP tomorrow. Pt will be on regular diet today.  ? ?[name] reports pt's appetite has been decreased for several weeks lately. Typically he eats 3 meals a day.  ?B-fast: raisin bran cereal w/ sugar and water, fruit cup, coffee ?Lunch: [employer] or soup and sandwich at home ?Dinner: His daughter cooks for them, typically meat, starch, vegetables ? 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 He states following a high fiber diet controls his diarrhea well. He also avoids dairy products. He tries to eat higher fiber food ands takes Metamucil pills daily. He also takes Vitamin D and B-12 daily as well given history of deficiencies. Will check folate levels as well.  ? ?Per pt, UBW is 239 lbs. This is the current recorded weight for this admission. Pt and wife suspect this is stated. His weight had decreased to 228 lbs at home, this occurred over the past month. This is a  4 % wt loss x 1 month, insignificant for time frame.  ? ? ?Medications reviewed. ? ?Labs reviewed: ? Low Na ? ?NUTRITION - FOCUSED PHYSICAL EXAM: ? ?Flowsheet Row Most Recent Value  ?Orbital Region Moderate depletion  ?Upper Arm Region Moderate depletion  ?Thoracic and Lumbar Region Unable to assess  ?Buccal Region Mild depletion  ?Temple Region Mild depletion  ?Clavicle Bone Region Moderate depletion  ?Clavicle and Acromion Bone Region Moderate depletion  ?Scapular Bone Region Moderate depletion  ?Dorsal Hand No depletion  ?Patellar Region Unable to assess  ?Anterior Thigh Region Unable to assess  ?Posterior Calf Region Unable to assess  ?Edema (RD Assessment) Mild  ?Hair Reviewed  ?Eyes Reviewed  [states altered vision at times, yellow sclera]  ?Mouth Reviewed  [dentures]  ?Skin Reviewed  [jaundice]  ? ?  ? ? ?Diet Order:   ?Diet Order   ? ?       ?  Diet NPO time specified  Diet effective midnight       ?  ?  Diet regular Room service appropriate? Yes; Fluid consistency: Thin  Diet effective now       ?  ? ?  ?  ? ?  ? ? ?EDUCATION NEEDS:  ? ?Education needs have been addressed ? ?Skin:  Skin Assessment: Reviewed RN Assessment ? ?Last BM:  3/16 -type 6 ? ?Height:  ? ?Ht Readings from Last 1 Encounters:  ?02/23/22 '5\' 9"'$  (1.753 m)  ? ? ?Weight:  ? ?Wt Readings from Last 1 Encounters:  ?02/23/22 108.4 kg  ? ? ?  BMI:  Body mass index is 35.29 kg/m?. ? ?Estimated Nutritional Needs:  ? ?Kcal:  2100-2300 ? ?Protein:  95-105g ? ?Fluid:  2.1L/day ? ?Eric Bibles, MS, RD, LDN ?Inpatient Clinical Dietitian ?Contact information available via Amion ? ?

## 2022-02-25 NOTE — Assessment & Plan Note (Addendum)
MRCP, ERCP and EUS with ERCP as above.  Pathology from initial ERCP concerning for adenocarcinoma.  Pathology from repeat ERCP pending.  Bilirubin and liver enzymes improving after biliary stent.  ?Recent Labs  ?Lab 02/26/22 ?0501 02/27/22 ?0530 02/28/22 ?8721 03/01/22 ?0248 03/02/22 ?0501  ?AST 185* 129* 112* 155* 95*  ?ALT 185* 178* 158* 194* 142*  ?ALKPHOS 371* 416* 339* 369* 270*  ?BILITOT 10.1* 7.8* 8.5* 11.5* 7.9*  ?PROT 4.9* 5.1* 4.8* 5.6* 4.7*  ?ALBUMIN 2.5* 2.5* 2.4* 2.8* 2.3*  ?-Recheck in the morning ?-Patient prefers to follow-up with his oncologist in [facility] ?-Outpatient follow-up with GI in 4 to 6 months for repeat ERCP ? ?

## 2022-02-25 NOTE — Assessment & Plan Note (Addendum)
Body mass index is 35.29 kg/m.

## 2022-02-26 ENCOUNTER — Inpatient Hospital Stay (HOSPITAL_COMMUNITY): Payer: Medicare Other | Admitting: Anesthesiology

## 2022-02-26 ENCOUNTER — Encounter (HOSPITAL_COMMUNITY)
Admission: EM | Disposition: A | Discharge status: Home Health | Payer: Self-pay | Source: Home / Self Care | Attending: Student

## 2022-02-26 ENCOUNTER — Encounter (HOSPITAL_COMMUNITY): Payer: Self-pay | Admitting: Internal Medicine

## 2022-02-26 ENCOUNTER — Inpatient Hospital Stay (HOSPITAL_COMMUNITY): Payer: Medicare Other

## 2022-02-26 DIAGNOSIS — R17 Unspecified jaundice: Secondary | ICD-10-CM

## 2022-02-26 DIAGNOSIS — R748 Abnormal levels of other serum enzymes: Secondary | ICD-10-CM | POA: Diagnosis not present

## 2022-02-26 DIAGNOSIS — D631 Anemia in chronic kidney disease: Secondary | ICD-10-CM

## 2022-02-26 DIAGNOSIS — R7989 Other specified abnormal findings of blood chemistry: Secondary | ICD-10-CM | POA: Diagnosis not present

## 2022-02-26 DIAGNOSIS — R8271 Bacteriuria: Secondary | ICD-10-CM

## 2022-02-26 DIAGNOSIS — K838 Other specified diseases of biliary tract: Secondary | ICD-10-CM

## 2022-02-26 DIAGNOSIS — N1831 Chronic kidney disease, stage 3a: Secondary | ICD-10-CM

## 2022-02-26 DIAGNOSIS — K831 Obstruction of bile duct: Secondary | ICD-10-CM | POA: Diagnosis not present

## 2022-02-26 DIAGNOSIS — R8281 Pyuria: Secondary | ICD-10-CM

## 2022-02-26 DIAGNOSIS — E440 Moderate protein-calorie malnutrition: Secondary | ICD-10-CM

## 2022-02-26 DIAGNOSIS — N183 Chronic kidney disease, stage 3 unspecified: Secondary | ICD-10-CM

## 2022-02-26 DIAGNOSIS — I129 Hypertensive chronic kidney disease with stage 1 through stage 4 chronic kidney disease, or unspecified chronic kidney disease: Secondary | ICD-10-CM

## 2022-02-26 HISTORY — PX: BIOPSY: SHX5522

## 2022-02-26 HISTORY — PX: ERCP: SHX5425

## 2022-02-26 LAB — COMPREHENSIVE METABOLIC PANEL
ALT: 185 U/L — ABNORMAL HIGH (ref 0–44)
AST: 185 U/L — ABNORMAL HIGH (ref 15–41)
Albumin: 2.5 g/dL — ABNORMAL LOW (ref 3.5–5.0)
Alkaline Phosphatase: 371 U/L — ABNORMAL HIGH (ref 38–126)
Anion gap: 7 (ref 5–15)
BUN: 26 mg/dL — ABNORMAL HIGH (ref 8–23)
CO2: 20 mmol/L — ABNORMAL LOW (ref 22–32)
Calcium: 8.2 mg/dL — ABNORMAL LOW (ref 8.9–10.3)
Chloride: 108 mmol/L (ref 98–111)
Creatinine, Ser: 1.22 mg/dL (ref 0.61–1.24)
GFR, Estimated: 56 mL/min — ABNORMAL LOW (ref >60)
Glucose, Bld: 106 mg/dL — ABNORMAL HIGH (ref 70–99)
Potassium: 4.1 mmol/L (ref 3.5–5.1)
Sodium: 135 mmol/L (ref 135–145)
Total Bilirubin: 10.1 mg/dL — ABNORMAL HIGH (ref 0.3–1.2)
Total Protein: 4.9 g/dL — ABNORMAL LOW (ref 6.5–8.1)

## 2022-02-26 LAB — CBC
HCT: 27.9 % — ABNORMAL LOW (ref 39.0–52.0)
Hemoglobin: 9.4 g/dL — ABNORMAL LOW (ref 13.0–17.0)
MCH: 34.6 pg — ABNORMAL HIGH (ref 26.0–34.0)
MCHC: 33.7 g/dL (ref 30.0–36.0)
MCV: 102.6 fL — ABNORMAL HIGH (ref 80.0–100.0)
Platelets: 218 10*3/uL (ref 150–400)
RBC: 2.72 MIL/uL — ABNORMAL LOW (ref 4.22–5.81)
RDW: 17 % — ABNORMAL HIGH (ref 11.5–15.5)
WBC: 6.9 10*3/uL (ref 4.0–10.5)
nRBC: 0 % (ref 0.0–0.2)

## 2022-02-26 LAB — FOLATE: Folate: 6.7 ng/mL (ref >5.9)

## 2022-02-26 LAB — MAGNESIUM: Magnesium: 2.1 mg/dL (ref 1.7–2.4)

## 2022-02-26 LAB — LIPASE, BLOOD: Lipase: 76 U/L — ABNORMAL HIGH (ref 11–51)

## 2022-02-26 LAB — PHOSPHORUS: Phosphorus: 3.2 mg/dL (ref 2.5–4.6)

## 2022-02-26 SURGERY — ERCP, WITH INTERVENTION IF INDICATED
Anesthesia: General

## 2022-02-26 MED ORDER — LIDOCAINE HCL (CARDIAC) PF 100 MG/5ML IV SOSY
PREFILLED_SYRINGE | INTRAVENOUS | Status: DC | PRN
Start: 2022-02-26 — End: 2022-02-26
  Administered 2022-02-26: 60 mg via INTRAVENOUS

## 2022-02-26 MED ORDER — LIP MEDEX EX OINT
TOPICAL_OINTMENT | CUTANEOUS | Status: DC | PRN
  Filled 2022-02-26: qty 7

## 2022-02-26 MED ORDER — PROPOFOL 1000 MG/100ML IV EMUL
INTRAVENOUS | Status: AC
  Filled 2022-02-26: qty 100

## 2022-02-26 MED ORDER — LACTATED RINGERS IV SOLN: INTRAVENOUS | Status: DC | PRN

## 2022-02-26 MED ORDER — SUGAMMADEX SODIUM 200 MG/2ML IV SOLN
INTRAVENOUS | Status: DC | PRN
  Administered 2022-02-26: 400 mg via INTRAVENOUS

## 2022-02-26 MED ORDER — GLUCAGON HCL RDNA (DIAGNOSTIC) 1 MG IJ SOLR
INTRAMUSCULAR | Status: AC
  Filled 2022-02-26: qty 2

## 2022-02-26 MED ORDER — PROPOFOL 500 MG/50ML IV EMUL
INTRAVENOUS | Status: AC
  Filled 2022-02-26: qty 100

## 2022-02-26 MED ORDER — PROPOFOL 10 MG/ML IV BOLUS
INTRAVENOUS | Status: DC | PRN
  Administered 2022-02-26: 100 mg via INTRAVENOUS

## 2022-02-26 MED ORDER — FENTANYL CITRATE (PF) 100 MCG/2ML IJ SOLN
INTRAMUSCULAR | Status: AC
  Filled 2022-02-26: qty 2

## 2022-02-26 MED ORDER — INDOMETHACIN 50 MG RE SUPP
RECTAL | Status: AC
  Filled 2022-02-26: qty 2

## 2022-02-26 MED ORDER — DEXAMETHASONE SODIUM PHOSPHATE 10 MG/ML IJ SOLN
INTRAMUSCULAR | Status: DC | PRN
  Administered 2022-02-26: 10 mg via INTRAVENOUS

## 2022-02-26 MED ORDER — ONDANSETRON HCL 4 MG/2ML IJ SOLN
INTRAMUSCULAR | Status: DC | PRN
  Administered 2022-02-26: 4 mg via INTRAVENOUS

## 2022-02-26 MED ORDER — FENTANYL CITRATE (PF) 100 MCG/2ML IJ SOLN
INTRAMUSCULAR | Status: DC | PRN
  Administered 2022-02-26 (×2): 50 ug via INTRAVENOUS
  Administered 2022-02-26: 100 ug via INTRAVENOUS

## 2022-02-26 MED ORDER — CHOLESTYRAMINE LIGHT 4 G PO PACK
4.0000 g | PACK | Freq: Two times a day (BID) | ORAL | Status: DC
  Administered 2022-02-26 – 2022-03-03 (×9): 4 g via ORAL
  Filled 2022-02-26 (×10): qty 1

## 2022-02-26 MED ORDER — ROCURONIUM BROMIDE 100 MG/10ML IV SOLN
INTRAVENOUS | Status: DC | PRN
Start: 2022-02-26 — End: 2022-02-26
  Administered 2022-02-26: 100 mg via INTRAVENOUS

## 2022-02-26 MED ORDER — SODIUM CHLORIDE 0.9 % IV SOLN
INTRAVENOUS | Status: DC | PRN
  Administered 2022-02-26: 25 mL

## 2022-02-26 NOTE — Progress Notes (Signed)
?PROGRESS NOTE ? ?Eric [name] CNO:[id number] DOB: 1929-10-26  ? ?PCP: Irish Lack., PA-C ? ?Patient is from: Home.  Lives with his wife.  Uses walker at baseline. ? ?DOA: 02/23/2022 LOS: 2 ? ?Chief complaints ?Chief Complaint  ?Patient presents with  ? Pruritis  ?  ? ?Brief Narrative / Interim history: ?88 year old M with PMH of colon rectal cancer, CKD-3B, HTN and BPH presenting with dark urine and pruritus for 4 days and about 10 pound unintentional weight loss in the month, and admitted for obstructive jaundice with elevated liver enzymes and hyperbilirubinemia.  CT A/P with several small scattered calcified liver granuloma, mild biliary ductal dilation but no calcified gallstone or pericholecystic fluid.  RUQ [place] with cholelithiasis without acute cholecystitis.  GI consulted.  MRCP with cholelithiasis, mild increase in caliber of CBD and main pancreatic duct up to the level of ampulla but no choledocholithiasis or obstructing mass.  ? ?Plan for ERCP on 3/17.  ? ?Subjective: ?Seen and examined earlier this morning.  No major events overnight of this morning.  No complaints.  He denies abdominal pain, nausea, vomiting, chest pain or dyspnea.  Denies UTI symptoms. ? ?Objective: ?Vitals:  ? 02/25/22 0541 02/25/22 1352 02/25/22 2103 02/26/22 0603  ?BP: (!) 129/57 134/74 (!) 148/68 (!) 105/51  ?Pulse: 95 (!) 104 (!) 103 89  ?Resp: '16 16 18 16  '$ ?Temp: 98.4 ?F (36.9 ?C) 97.7 ?F (36.5 ?C) 98.3 ?F (36.8 ?C) 98.3 ?F (36.8 ?C)  ?TempSrc: Oral Oral Oral Oral  ?SpO2: 99% 97% 98% 98%  ?Weight:      ?Height:      ? ? ?Examination: ? ?GENERAL: No apparent distress.  Nontoxic. ?HEENT: MMM.  Sclerae icteric. ?NECK: Supple.  No apparent JVD.  ?RESP:  No IWOB.  Fair aeration bilaterally. ?CVS:  RRR. Heart sounds normal.  ?ABD/GI/GU: BS+. Abd soft, NTND.  ?MSK/EXT:  Moves extremities. No apparent deformity. No edema.  ?SKIN: Skin jaundiced. ?NEURO: Awake and alert. Oriented appropriately.  No apparent focal neuro  deficit. ?PSYCH: Calm. Normal affect.  ? ?Procedures:  ?None ? ?Microbiology summarized: ?COVID-19 and influenza PCR nonreactive. ?Urine culture with insignificant growth. ? ?Assessment and Plan: ?* Obstructive jaundice ?MRCP with cholelithiasis, mild increased caliber of CBD and main pancreatic duct up to the level of the ampulla but no choledocholithiasis or obstructing mass.  ?Recent Labs  ?Lab 02/23/22 ?2111 02/24/22 ?1021 02/25/22 ?2947 02/26/22 ?0501  ?AST 153* 159* 161* 185*  ?ALT 240* 216* 188* 185*  ?ALKPHOS 438* 413* 386* 371*  ?BILITOT 11.2* 9.8* 10.2* 10.1*  ?PROT 6.0* 5.3* 5.1* 4.9*  ?ALBUMIN 3.0* 2.8* 2.6* 2.5*  ?-GI following-plan for ERCP today ? ? ?Hyperbilirubinemia ?See obstructive jaundice ? ?Elevated LFTs ?See obstructive jaundice ? ?Macrocytic anemia ?Recent Labs  ?  02/23/22 ?2111 02/24/22 ?1021 02/25/22 ?6546 02/26/22 ?0501  ?HGB 11.7* 11.0* 10.5* 9.4*  ?H&H stable after initial drop likely dilutional. ?-Check anemia panel ?-Monitor ? ?Hyponatremia ?Resolved ? ?Bacteriuria with pyuria ?Patient denies UTI symptoms.  Started on IV ceftriaxone.  Urine culture with insignificant growth.  Received ceftriaxone for 2 days.  UTI ruled out. ?-Discontinue ceftriaxone. ? ?Elevated lipase ?See obstructive jaundice ? ?Malnutrition of moderate degree ?Nutrition Status: ?Nutrition Problem: Moderate Malnutrition ?Etiology: acute illness (obstructive jaundice) ?Signs/Symptoms: energy intake < 75% for > 7 days, moderate fat depletion, moderate muscle depletion ?Interventions: Boost Breeze, MVI ? ?Class II obesity ?Body mass index is 35.29 kg/m?.  ? ?Metabolic acidosis ?Likely due to renal failure and IV fluid. ?-Continue  monitoring ? ?History of colorectal cancer ?Outpatient follow-up. ? ?HTN (hypertension) ?Normotensive but soft. ?-Discontinue lisinopril ?-Continue holding torsemide ? ?Benign prostatic hyperplasia ?Not on medication. ?-Monitor urine output ? ?CKD stage G3a/A3, GFR 45-59 and albumin creatinine  ratio >300 mg/g (HCC) ?Recent Labs  ?  02/23/22 ?2111 02/24/22 ?1021 02/25/22 ?0962 02/26/22 ?0501  ?BUN '22 21 22 '$ 26*  ?CREATININE 1.45* 1.36* 1.29* 1.22  ?-Monitor ? ? ? ?  ?DVT prophylaxis:  ?SCDs Start: 02/24/22 1021 ? ?Code Status: Full code ?Family Communication: Updated patient's wife at bedside on 3/16. ?Level of care: Telemetry ?Status is: Inpatient ?Remains inpatient appropriate because: Obstructive jaundice with elevated liver enzymes and hyperbilirubinemia requiring further evaluation/ERCP ? ? ?Final disposition: TBD ? ?Consultants:  ?Gastroenterology ? ?Sch Meds:  ?Scheduled Meds: ? feeding supplement  1 Container Oral TID BM  ? multivitamin with minerals  1 tablet Oral Daily  ? vitamin B-12  100 mcg Oral Daily  ? ?Continuous Infusions: ? sodium chloride 100 mL/hr at 02/26/22 0317  ? sodium chloride 20 mL/hr at 02/26/22 0317  ? ?PRN Meds:.acetaminophen **OR** acetaminophen, ondansetron **OR** ondansetron (ZOFRAN) IV, oxyCODONE ? ?Antimicrobials: ?Anti-infectives (From admission, onward)  ? ? Start     Dose/Rate Route Frequency Ordered Stop  ? 02/24/22 2200  cefTRIAXone (ROCEPHIN) 2 g in sodium chloride 0.9 % 100 mL IVPB  Status:  Discontinued       ? 2 g ?200 mL/hr over 30 Minutes Intravenous Every 24 hours 02/24/22 1025 02/26/22 1007  ? 02/24/22 0030  cefTRIAXone (ROCEPHIN) 1 g in sodium chloride 0.9 % 100 mL IVPB       ? 1 g ?200 mL/hr over 30 Minutes Intravenous  Once 02/24/22 0017 02/24/22 0100  ? ?  ? ? ? ?I have personally reviewed the following labs and images: ?CBC: ?Recent Labs  ?Lab 02/23/22 ?2111 02/24/22 ?1021 02/25/22 ?8366 02/26/22 ?0501  ?WBC 5.6 6.6 6.2 6.9  ?NEUTROABS 4.1 5.0  --   --   ?HGB 11.7* 11.0* 10.5* 9.4*  ?HCT 33.4* 32.1* 30.1* 27.9*  ?MCV 100.6* 103.2* 101.7* 102.6*  ?PLT 262 232 227 218  ? ?BMP &GFR ?Recent Labs  ?Lab 02/23/22 ?2111 02/24/22 ?1021 02/25/22 ?2947 02/26/22 ?0501  ?NA 131* 132* 134* 135  ?K 3.9 4.2 4.2 4.1  ?CL 100 104 105 108  ?CO2 21* 21* 20* 20*  ?GLUCOSE  116* 102* 92 106*  ?BUN '22 21 22 '$ 26*  ?CREATININE 1.45* 1.36* 1.29* 1.22  ?CALCIUM 8.5* 8.4* 8.3* 8.2*  ?MG  --   --   --  2.1  ?PHOS  --   --   --  3.2  ? ?Estimated Creatinine Clearance: 48.8 mL/min (by C-G formula based on SCr of 1.22 mg/dL). ?Liver & Pancreas: ?Recent Labs  ?Lab 02/23/22 ?2111 02/24/22 ?1021 02/25/22 ?6546 02/26/22 ?0501  ?AST 153* 159* 161* 185*  ?ALT 240* 216* 188* 185*  ?ALKPHOS 438* 413* 386* 371*  ?BILITOT 11.2* 9.8* 10.2* 10.1*  ?PROT 6.0* 5.3* 5.1* 4.9*  ?ALBUMIN 3.0* 2.8* 2.6* 2.5*  ? ?Recent Labs  ?Lab 02/23/22 ?2111 02/25/22 ?5035 02/26/22 ?0501  ?LIPASE 112* 61* 76*  ? ?No results for input(s): AMMONIA in the last 168 hours. ?Diabetic: ?No results for input(s): HGBA1C in the last 72 hours. ?No results for input(s): GLUCAP in the last 168 hours. ?Cardiac Enzymes: ?No results for input(s): CKTOTAL, CKMB, CKMBINDEX, TROPONINI in the last 168 hours. ?No results for input(s): PROBNP in the last 8760 hours. ?Coagulation Profile: ?Recent Labs  ?Lab  02/24/22 ?1243  ?INR 1.0  ? ?Thyroid Function Tests: ?No results for input(s): TSH, T4TOTAL, FREET4, T3FREE, THYROIDAB in the last 72 hours. ?Lipid Profile: ?No results for input(s): CHOL, HDL, LDLCALC, TRIG, CHOLHDL, LDLDIRECT in the last 72 hours. ?Anemia Panel: ?Recent Labs  ?  02/26/22 ?0501  ?FOLATE 6.7  ? ?Urine analysis: ?   ?Component Value Date/Time  ? COLORURINE AMBER (A) 02/23/2022 1854  ? APPEARANCEUR CLOUDY (A) 02/23/2022 1854  ? LABSPEC 1.025 02/23/2022 1854  ? PHURINE 5.5 02/23/2022 1854  ? GLUCOSEU 100 (A) 02/23/2022 1854  ? [place]lyn NEGATIVE 02/23/2022 1854  ? BILIRUBINUR LARGE (A) 02/23/2022 1854  ? Benjamin Stain NEGATIVE 02/23/2022 1854  ? PROTEINUR 30 (A) 02/23/2022 1854  ? UROBILINOGEN 1.0 01/08/2009 1441  ? NITRITE NEGATIVE 02/23/2022 1854  ? LEUKOCYTESUR LARGE (A) 02/23/2022 1854  ? ?Sepsis Labs: ?Invalid input(s): PROCALCITONIN, LACTICIDVEN ? ?Microbiology: ?Recent Results (from the past 240 hour(s))  ?Urine Culture     Status:  Abnormal  ? Collection Time: 02/23/22  6:54 PM  ? Specimen: Urine, Clean Catch  ?Result Value Ref Range Status  ? Specimen Description   Final  ?  URINE, CLEAN CATCH ?Performed at [facility], [place]

## 2022-02-26 NOTE — Anesthesia Postprocedure Evaluation (Signed)
Anesthesia Post Note ? ?Patient: Eric [name] ? ?Procedure(s) Performed: ENDOSCOPIC RETROGRADE CHOLANGIOPANCREATOGRAPHY (ERCP) ?BIOPSY ? ?  ? ?Patient location during evaluation: PACU ?Anesthesia Type: General ?Level of consciousness: awake and alert, oriented and patient cooperative ?Pain management: pain level controlled ?Vital Signs Assessment: post-procedure vital signs reviewed and stable ?Respiratory status: spontaneous breathing, nonlabored ventilation and respiratory function stable ?Cardiovascular status: blood pressure returned to baseline and stable ?Postop Assessment: no apparent nausea or vomiting ?Anesthetic complications: no ? ? ?No notable events documented. ? ?Last Vitals:  ?Vitals:  ? 02/26/22 1258 02/26/22 1423  ?BP: (!) 150/69 (!) 174/78  ?Pulse: 82 (!) 113  ?Resp: 13 20  ?Temp: 36.6 ?C 36.6 ?C  ?SpO2: 100%   ?  ?Last Pain:  ?Vitals:  ? 02/26/22 1423  ?TempSrc: Temporal  ?PainSc: 0-No pain  ? ? ?  ?  ?  ?  ?  ?  ? ?Jarome Matin Lachlan Mckim ? ? ? ? ?

## 2022-02-26 NOTE — Transfer of Care (Signed)
Immediate Anesthesia Transfer of Care Note ? ?Patient: Marshal Eskew Heiman ? ?Procedure(s) Performed: ENDOSCOPIC RETROGRADE CHOLANGIOPANCREATOGRAPHY (ERCP) ?BIOPSY ? ?Patient Location: Endoscopy Unit ? ?Anesthesia Type:General ? ?Level of Consciousness: awake, drowsy and patient cooperative ? ?Airway & Oxygen Therapy: Patient Spontanous Breathing and Patient connected to face mask oxygen ? ?Post-op Assessment: Report given to RN and Post -op Vital signs reviewed and stable ? ?Post vital signs: Reviewed and stable ? ?Last Vitals:  ?Vitals Value Taken Time  ?BP 174/78   ?Temp    ?Pulse 114 02/26/22 1423  ?Resp 13 02/26/22 1423  ?SpO2 100 % 02/26/22 1423  ?Vitals shown include unvalidated device data. ? ?Last Pain:  ?Vitals:  ? 02/26/22 1258  ?TempSrc: Oral  ?PainSc:   ?   ? ?  ? ?Complications: No notable events documented. ?

## 2022-02-26 NOTE — Progress Notes (Addendum)
? ? Progress Note ? ? Subjective  ?Day #3 ? ?Chief Complaint: Painless jaundice, elevated LFTs ? ?Patient feels the same this morning, no change.  He and his wife are both ready for his procedure later today.  They did have a lot of questions. ? ? ? Objective  ? ?Vital signs in last 24 hours: ?Temp:  [97.7 ?F (36.5 ?C)-98.3 ?F (36.8 ?C)] 98.3 ?F (36.8 ?C) (03/17 2426) ?Pulse Rate:  [89-104] 89 (03/17 0603) ?Resp:  [16-18] 16 (03/17 0603) ?BP: (105-148)/(51-74) 105/51 (03/17 0603) ?SpO2:  [97 %-98 %] 98 % (03/17 0603) ?Last BM Date : 02/25/22 ?General:    Jaundiced, elderly male in NAD ?Heart:  Regular rate and rhythm; no murmurs ?Lungs: Respirations even and unlabored, lungs CTA bilaterally ?Abdomen:  Soft, nontender and nondistended. Normal bowel sounds. ?Psych:  Cooperative. Normal mood and affect. ? ?Intake/Output from previous day: ?03/16 0701 - 03/17 0700 ?In: 2018.9 [P.O.:600; I.V.:1218.9; IV Piggyback:200] ?Out: 550 [Urine:550] ? ? ?Lab Results: ?Recent Labs  ?  02/24/22 ?1021 02/25/22 ?8341 02/26/22 ?0501  ?WBC 6.6 6.2 6.9  ?HGB 11.0* 10.5* 9.4*  ?HCT 32.1* 30.1* 27.9*  ?PLT 232 227 218  ? ?BMET ?Recent Labs  ?  02/24/22 ?1021 02/25/22 ?9622 02/26/22 ?0501  ?NA 132* 134* 135  ?K 4.2 4.2 4.1  ?CL 104 105 108  ?CO2 21* 20* 20*  ?GLUCOSE 102* 92 106*  ?BUN 21 22 26*  ?CREATININE 1.36* 1.29* 1.22  ?CALCIUM 8.4* 8.3* 8.2*  ? ?LFT ?Recent Labs  ?  02/26/22 ?0501  ?PROT 4.9*  ?ALBUMIN 2.5*  ?AST 185*  ?ALT 185*  ?ALKPHOS 371*  ?BILITOT 10.1*  ? ?PT/INR ?Recent Labs  ?  02/24/22 ?1243  ?LABPROT 13.0  ?INR 1.0  ? ? ?Studies/Results: ?MR 3D Recon At Scanner ? ?Result Date: 02/25/2022 ?CLINICAL DATA:  Right upper quadrant abdominal pain. EXAM: MRI ABDOMEN WITHOUT AND WITH CONTRAST (INCLUDING MRCP) TECHNIQUE: Multiplanar multisequence MR imaging of the abdomen was performed both before and after the administration of intravenous contrast. Heavily T2-weighted images of the biliary and pancreatic ducts were obtained, and  three-dimensional MRCP images were rendered by post processing. CONTRAST:  79m GADAVIST GADOBUTROL 1 MMOL/ML IV SOLN COMPARISON:  02/23/2022 FINDINGS: Lower chest: No acute findings Hepatobiliary: No mass or other parenchymal abnormality identified. Stone within the dependent portion of the gallbladder measures 7 mm, image 19/4. The common bile duct is mildly increased in caliber up to the level of the ampulla measuring 7 mm. There is no sign of choledocholithiasis. Pancreas: There is main duct dilatation up to the level of the ampulla. At the level of the head of pancreas the main pancreatic duct measures 7 mm. This is a new finding when compared with 08/21/2020. No discrete, measurable mass. No signs of pancreatic inflammation. Spleen:  Within normal limits in size and appearance. Adrenals/Urinary Tract: Normal appearance of the adrenal glands. Bilateral renal cortical thinning is identified. Simple appearing cyst are noted bilaterally. The largest arises off the inferior pole of the left kidney measuring 2.4 cm. These require no further follow-up. No signs of hydronephrosis. Stomach/Bowel: Visualized portions within the abdomen are unremarkable. Vascular/Lymphatic: Aortic atherosclerosis. No aneurysm. The upper abdominal vascularity appears patent. No adenopathy identified. Other:  None. Musculoskeletal: No suspicious bone lesions identified. IMPRESSION: 1. Gallstone. 2. Mild increase caliber of the common bile duct and main pancreatic duct up to the level of the ampulla. No sign of choledocholithiasis or obstructing mass. This is a new finding when compared with  08/21/2020. If there is clinical concern for ampullary stricture or lesion consider further evaluation with ERCP. None Electronically Signed   By: Kerby Moors M.D.   On: 02/25/2022 08:21  ? ?MR ABDOMEN MRCP W WO CONTAST ? ?Result Date: 02/25/2022 ?CLINICAL DATA:  Right upper quadrant abdominal pain. EXAM: MRI ABDOMEN WITHOUT AND WITH CONTRAST  (INCLUDING MRCP) TECHNIQUE: Multiplanar multisequence MR imaging of the abdomen was performed both before and after the administration of intravenous contrast. Heavily T2-weighted images of the biliary and pancreatic ducts were obtained, and three-dimensional MRCP images were rendered by post processing. CONTRAST:  58m GADAVIST GADOBUTROL 1 MMOL/ML IV SOLN COMPARISON:  02/23/2022 FINDINGS: Lower chest: No acute findings Hepatobiliary: No mass or other parenchymal abnormality identified. Stone within the dependent portion of the gallbladder measures 7 mm, image 19/4. The common bile duct is mildly increased in caliber up to the level of the ampulla measuring 7 mm. There is no sign of choledocholithiasis. Pancreas: There is main duct dilatation up to the level of the ampulla. At the level of the head of pancreas the main pancreatic duct measures 7 mm. This is a new finding when compared with 08/21/2020. No discrete, measurable mass. No signs of pancreatic inflammation. Spleen:  Within normal limits in size and appearance. Adrenals/Urinary Tract: Normal appearance of the adrenal glands. Bilateral renal cortical thinning is identified. Simple appearing cyst are noted bilaterally. The largest arises off the inferior pole of the left kidney measuring 2.4 cm. These require no further follow-up. No signs of hydronephrosis. Stomach/Bowel: Visualized portions within the abdomen are unremarkable. Vascular/Lymphatic: Aortic atherosclerosis. No aneurysm. The upper abdominal vascularity appears patent. No adenopathy identified. Other:  None. Musculoskeletal: No suspicious bone lesions identified. IMPRESSION: 1. Gallstone. 2. Mild increase caliber of the common bile duct and main pancreatic duct up to the level of the ampulla. No sign of choledocholithiasis or obstructing mass. This is a new finding when compared with 08/21/2020. If there is clinical concern for ampullary stricture or lesion consider further evaluation with ERCP.  None Electronically Signed   By: TKerby MoorsM.D.   On: 02/25/2022 08:21   ? ? ? ? Assessment / Plan:   ?Assessment: ?1.  Painless jaundice with elevated LFTs: LFTs remain stable overnight, ultrasound with cholelithiasis and no evidence of acute cholecystitis, CT with mild increased caliber of the CBD, MRCP with no defined stricture but dilated bile duct, concern for obstructive lesion at the level of the ampulla or possibly an occult lesion of the pancreas, in the head, ERCP planned today ?2.  History of adenocarcinoma of the rectum: Diagnosed 2019, completed a course of chemotherapy and radiation, no evidence for progression by CT as of September 2022, [facility]?3.  CKD ?4.  Hypertension ?5.  Mild anemia ? ?Plan: ?1.  CEA returned normal ?2.  Plans for ERCP this afternoon scheduled at 115, patient will come down to the endoscopy suite around 1215.  Discussed this procedure in detail and answered many questions.  Used whiteboard in the room to discuss anatomy and explained the procedure to the patient and his wife.  They verbalized understanding. Reviewed risks. ?3.  Further recommendations after procedure today. ?4.  Patient remain n.p.o. until after time procedure. ?5.  Discussed Indomethacin suppository, he does have NSAIDs on his list as a high allergy due to renal insufficiency, this is why suppositories were not ordered yesterday, though I feel that this is not prohibitive for an Indomethacin suppository after ERCP if Dr.  Henrene Pastor would like to add it. ?6.  There were some questions about how long patient will need to stay, explained that we will know more after time of procedure. ? ?Thank you for kind consultation, we will continue to follow ? ? LOS: 2 days  ? ?Lavone Nian [facility]  02/26/2022, 9:45 AM ? ?GI ATTENDING ? ?Info history data reviewed.  Patient seen at the bedside preprocedure.  No interval problems.  No new questions.  Procedure again reviewed. ? ?  ?

## 2022-02-26 NOTE — Assessment & Plan Note (Addendum)
Nutrition Status: ?Nutrition Problem: Moderate Malnutrition ?Etiology: acute illness (obstructive jaundice) ?Signs/Symptoms: energy intake < 75% for > 7 days, moderate fat depletion, moderate muscle depletion ?Interventions: Boost Breeze, MVI ?

## 2022-02-26 NOTE — Op Note (Signed)
[facility] ?Patient Name: Eric [name] ?Procedure Date: 02/26/2022 ?MRN: [id number] ?Attending MD: John N. Perry , MD ?Date of Birth: 08/26/1929 ?CSN: [id number] ?Age: 88 ?Admit Type: Ambulatory ?Procedure:                ERCP with biopsies ?Indications:              Abnormal abdominal MRI, Jaundice ?Providers:                John N. Perry, MD, Lisa Nunn, RN, Anthony Gillies,  ?                          Technician ?Referring MD:             [facility] ?Medicines:                General Anesthesia ?Complications:            No immediate complications. ?Estimated Blood Loss:     Estimated blood loss: none. ?Procedure:                Pre-Anesthesia Assessment: ?                          - Prior to the procedure, a History and Physical  ?                          was performed, and patient medications and  ?                          allergies were reviewed. The patient's tolerance of  ?                          previous anesthesia was also reviewed. The risks  ?                          and benefits of the procedure and the sedation  ?                          options and risks were discussed with the patient.  ?                          All questions were answered, and informed consent  ?                          was obtained. Prior Anticoagulants: The patient has  ?                          taken no previous anticoagulant or antiplatelet  ?                          agents. ASA Grade Assessment: II - A patient with  ?                          mild systemic disease. After reviewing the risks  ?                            and benefits, the patient was deemed in  ?                          satisfactory condition to undergo the procedure. ?                          After obtaining informed consent, the scope was  ?                          passed under direct vision. Throughout the  ?                          procedure, the patient's blood pressure, pulse, and  ?                          oxygen saturations  were monitored continuously. The  ?                          [id number]-[id number] ([id number]) Olympus duodenoscope was  ?                          introduced through the mouth, and used to inject  ?                          contrast into and used to inject contrast into the  ?                          bile duct. The ERCP was technically difficult and  ?                          complex due to challenging cannulation because of  ?                          abnormal anatomy. The patient tolerated the  ?                          procedure well. ?Scope In: ?Scope Out: ?Findings: ?     1. The stomach was unremarkable (H/H). The duodenum was unremarkable ?     2. The ampulla sat within a diverticulum. It wasprominent frondy and  ?     friable but not mass like. Multiple biopsies taken after failed  ?     cannulation. ?     3. The scout film was normal. ?     4, Failed cannulation after a number of attempts due to deep seeded  ?     somewhat abnormal ampulla with contact bleeding ?Impression:               1. The stomach was unremarkable (H/H). The duodenum  ?                          was unremarkable ?                          2. The ampulla sat within a diverticulum. It  ?  wasprominent frondy and friable but not mass like.  ?                          Multiple biopsies taken after failed cannulation. ?                          3. The scout film was normal. ?                          4, Failed cannulation after a number of attempts  ?                          due to deep seeded somewhat abnormal ampulla with  ?                          contact bleeding. ?Moderate Sedation: ?     none ?Recommendation:           1. Follow up biopsies ?                          2. Confer with EUS colleagues ?                          3. Next step EUS/ERCP vs. PTC. ?                          CPT CODE 10258 ?Procedure Code(s):        --- Professional --- ?                          [id number], Endoscopic retrograde  ?                           cholangiopancreatography (ERCP); with biopsy,  ?                          single or multiple ?Diagnosis Code(s):        --- Professional --- ?                          R17, Unspecified jaundice ?                          R93.5, Abnormal findings on diagnostic imaging of  ?                          other abdominal regions, including retroperitoneum ?CPT copyright 2019 [employer]. All rights reserved. ?The codes documented in this report are preliminary and upon coder review may  ?be revised to meet current compliance requirements. ?Docia Chuck. Henrene Pastor, MD ?02/26/2022 2:39:02 PM ?This report has been signed electronically. ?Number of Addenda: 0 ?

## 2022-02-26 NOTE — Anesthesia Preprocedure Evaluation (Addendum)
Anesthesia Evaluation  ?Patient identified by MRN, date of birth, ID band ?Patient awake ? ? ? ?Reviewed: ?Allergy & Precautions, NPO status , Patient's Chart, lab work & pertinent test results ? ?Airway ?Mallampati: III ? ?TM Distance: >3 FB ?Neck ROM: Full ? ? ? Dental ? ?(+) Edentulous Upper ?  ?Pulmonary ?former smoker,  ?  ?Pulmonary exam normal ?breath sounds clear to auscultation ? ? ? ? ? ? Cardiovascular ?hypertension (150/69 in preop, per pt normally SBPs 140s), Pt. on medications ?Normal cardiovascular exam ?Rhythm:Regular Rate:Normal ? ? ?  ?Neuro/Psych ?negative neurological ROS ? negative psych ROS  ? GI/Hepatic ?negative GI ROS, Dilated CBD  ?  ?Endo/Other  ?Obesity BMI 35 ? Renal/GU ?Renal diseaseCKD 3, Cr 1.22  ?negative genitourinary ?  ?Musculoskeletal ? ?(+) Arthritis , Osteoarthritis,   ? Abdominal ?  ?Peds ? Hematology ? ?(+) Blood dyscrasia, anemia , hb 9.4   ?Anesthesia Other Findings ? ? Reproductive/Obstetrics ?negative OB ROS ? ?  ? ? ? ? ? ? ? ? ? ? ? ? ? ?  ?  ? ? ? ? ? ? ? ?Anesthesia Physical ?Anesthesia Plan ? ?ASA: 3 ? ?Anesthesia Plan: General  ? ?Post-op Pain Management:   ? ?Induction: Intravenous ? ?PONV Risk Score and Plan: 2 and Ondansetron, Dexamethasone and Treatment may vary due to age or medical condition ? ?Airway Management Planned: Oral ETT ? ?Additional Equipment: None ? ?Intra-op Plan:  ? ?Post-operative Plan: Extubation in OR ? ?Informed Consent: I have reviewed the patients History and Physical, chart, labs and discussed the procedure including the risks, benefits and alternatives for the proposed anesthesia with the patient or authorized representative who has indicated his/her understanding and acceptance.  ? ? ? ?Dental advisory given ? ?Plan Discussed with: CRNA ? ?Anesthesia Plan Comments:   ? ? ? ? ? ?Anesthesia Quick Evaluation ? ?

## 2022-02-26 NOTE — Evaluation (Signed)
Physical Therapy Evaluation ?Patient Details ?Name: Eric [name] ?MRN: [id number] ?DOB: 1929-11-09 ?Today's Date: 02/26/2022 ? ?History of Present Illness ? Patient is a 88 year old male admitted with 4 days of dark urine and pruritis. Dx of obstructive jaundice.  MRCP on 3/15, possible ERCP 3/16. PMH: HTN, BPH, colorectal cancer  ?Clinical Impression ? Pt admitted with above diagnosis. Pt ambulated 15' x 2 with RW, distance limited by fatigue, no loss of balance. At baseline he ambulates independently with a RW. Noted plan for ERCP later today.  Pt currently with functional limitations due to the deficits listed below (see PT Problem List). Pt will benefit from skilled PT to increase their independence and safety with mobility to allow discharge to the venue listed below.   ?   ?   ? ?Recommendations for follow up therapy are one component of a multi-disciplinary discharge planning process, led by the attending physician.  Recommendations may be updated based on patient status, additional functional criteria and insurance authorization. ? ?Follow Up Recommendations Home health PT ? ?  ?Assistance Recommended at Discharge Intermittent Supervision/Assistance  ?Patient can return home with the following ? A little help with bathing/dressing/bathroom;Assistance with cooking/housework;Assist for transportation ? ?  ?Equipment Recommendations None recommended by PT  ?Recommendations for Other Services ?    ?  ?Functional Status Assessment Patient has had a recent decline in their functional status and demonstrates the ability to make significant improvements in function in a reasonable and predictable amount of time.  ? ?  ?Precautions / Restrictions Precautions ?Precautions: Fall ?Precaution Comments: diarrhea ?Restrictions ?Weight Bearing Restrictions: No  ? ?  ? ?Mobility ? Bed Mobility ?Overal bed mobility: Modified Independent ?  ?  ?  ?  ?  ?  ?  ?  ? ?Transfers ?Overall transfer level: Needs  assistance ?Equipment used: Rolling walker (2 wheels) ?Transfers: Sit to/from Stand ?Sit to Stand: Supervision ?  ?  ?  ?  ?  ?General transfer comment: steady, supervision for safety 2* pt reports "feeling weak" ?  ? ?Ambulation/Gait ?Ambulation/Gait assistance: Supervision ?Gait Distance (Feet): 30 Feet ?Assistive device: Rolling walker (2 wheels) ?Gait Pattern/deviations: Step-through pattern, Decreased stride length ?Gait velocity: decr ?  ?  ?General Gait Details: steady with RW, distance limited by fatigue ? ?Stairs ?  ?  ?  ?  ?  ? ?Wheelchair Mobility ?  ? ?Modified Rankin (Stroke Patients Only) ?  ? ?  ? ?Balance Overall balance assessment: Needs assistance ?Sitting-balance support: Feet supported ?Sitting balance-Leahy Scale: Good ?  ?  ?Standing balance support: Reliant on assistive device for balance, During functional activity ?Standing balance-Leahy Scale: Poor ?  ?  ?  ?  ?  ?  ?  ?  ?  ?  ?  ?  ?   ? ? ? ?Pertinent Vitals/Pain Pain Assessment ?Faces Pain Scale: No hurt  ? ? ?Home Living Family/patient expects to be discharged to:: Private residence ?Living Arrangements: Spouse/significant other;Children ?Available Help at Discharge: Family ?Type of Home: House ?Home Access: Stairs to enter ?Entrance Stairs-Rails: Right ?Entrance Stairs-Number of Steps: 3 ?  ?Home Layout: Able to live on main level with bedroom/bathroom ?Home Equipment: Conservation officer, nature (2 wheels);Wheelchair - manual;Shower seat;Grab bars - tub/shower;Grab bars - toilet (rollator is spouse's, grab bars in shower are suction cup) ?   ?  ?Prior Function Prior Level of Function : Independent/Modified Independent ?  ?  ?  ?  ?  ?  ?Mobility Comments: ambulates with  rolling walker ?ADLs Comments: mod I with BADLs ?  ? ? ?Hand Dominance  ? Dominant Hand: Right ? ?  ?Extremity/Trunk Assessment  ? Upper Extremity Assessment ?Upper Extremity Assessment: Defer to OT evaluation ?  ? ?Lower Extremity Assessment ?Lower Extremity Assessment: Overall  WFL for tasks assessed ?  ? ?Cervical / Trunk Assessment ?Cervical / Trunk Assessment: Normal  ?Communication  ? Communication: No difficulties  ?Cognition Arousal/Alertness: Awake/alert ?Behavior During Therapy: [facility] for tasks assessed/performed ?Overall Cognitive Status: Within Functional Limits for tasks assessed ?  ?  ?  ?  ?  ?  ?  ?  ?  ?  ?  ?  ?  ?  ?  ?  ?  ?  ?  ? ?  ?General Comments   ? ?  ?Exercises    ? ?Assessment/Plan  ?  ?PT Assessment Patient needs continued PT services  ?PT Problem List Decreased activity tolerance;Obesity ? ?   ?  ?PT Treatment Interventions Gait training;Therapeutic exercise;Patient/family education   ? ?PT Goals (Current goals can be found in the Care Plan section)  ?Acute Rehab PT Goals ?Patient Stated Goal: return to independence with mobility ?PT Goal Formulation: With patient ?Time For Goal Achievement: 03/12/22 ?Potential to Achieve Goals: Good ? ?  ?Frequency Min 3X/week ?  ? ? ?Co-evaluation   ?  ?  ?  ?  ? ? ?  ?AM-PAC PT "6 Clicks" Mobility  ?Outcome Measure Help needed turning from your back to your side while in a flat bed without using bedrails?: None ?Help needed moving from lying on your back to sitting on the side of a flat bed without using bedrails?: A Little ?Help needed moving to and from a bed to a chair (including a wheelchair)?: None ?Help needed standing up from a chair using your arms (e.g., wheelchair or bedside chair)?: A Little ?Help needed to walk in hospital room?: A Little ?Help needed climbing 3-5 steps with a railing? : A Little ?6 Click Score: 20 ? ?  ?End of Session Equipment Utilized During Treatment: Gait belt ?Activity Tolerance: Patient limited by fatigue ?Patient left: in chair;with call bell/phone within reach;with family/visitor present;with chair alarm set ?Nurse Communication: Mobility status ?PT Visit Diagnosis: Difficulty in walking, not elsewhere classified (R26.2) ?  ? ?Time: 1020-1039 ?PT Time Calculation (min) (ACUTE ONLY): 19  min ? ? ?Charges:   PT Evaluation ?$PT Eval Moderate Complexity: 1 Mod ?  ?  ?   ? ?Blondell Reveal Kistler PT 02/26/2022  ?Acute Rehabilitation Services ?Pager [phone] ?Office [phone] ? ? ?

## 2022-02-26 NOTE — Anesthesia Procedure Notes (Signed)
Procedure Name: Intubation ?Date/Time: 02/26/2022 1:21 PM ?Performed by: Raenette Rover, CRNA ?Pre-anesthesia Checklist: Patient identified, Emergency Drugs available, Suction available and Patient being monitored ?Patient Re-evaluated:Patient Re-evaluated prior to induction ?Oxygen Delivery Method: Circle system utilized ?Preoxygenation: Pre-oxygenation with 100% oxygen ?Induction Type: IV induction ?Ventilation: Mask ventilation without difficulty ?Laryngoscope Size: Mac and 4 ?Grade View: Grade I ?Tube type: Oral ?Tube size: 7.5 mm ?Number of attempts: 1 ?Airway Equipment and Method: Stylet ?Placement Confirmation: ETT inserted through vocal cords under direct vision, positive ETCO2 and breath sounds checked- equal and bilateral ?Secured at: 22 cm ?Tube secured with: Tape ?Dental Injury: Teeth and Oropharynx as per pre-operative assessment  ? ? ? ? ?

## 2022-02-27 DIAGNOSIS — R935 Abnormal findings on diagnostic imaging of other abdominal regions, including retroperitoneum: Secondary | ICD-10-CM | POA: Diagnosis not present

## 2022-02-27 DIAGNOSIS — R748 Abnormal levels of other serum enzymes: Secondary | ICD-10-CM | POA: Diagnosis not present

## 2022-02-27 DIAGNOSIS — K838 Other specified diseases of biliary tract: Secondary | ICD-10-CM

## 2022-02-27 DIAGNOSIS — R7989 Other specified abnormal findings of blood chemistry: Secondary | ICD-10-CM | POA: Diagnosis not present

## 2022-02-27 DIAGNOSIS — K831 Obstruction of bile duct: Secondary | ICD-10-CM | POA: Diagnosis not present

## 2022-02-27 LAB — CBC
HCT: 28.1 % — ABNORMAL LOW (ref 39.0–52.0)
Hemoglobin: 9.9 g/dL — ABNORMAL LOW (ref 13.0–17.0)
MCH: 35.6 pg — ABNORMAL HIGH (ref 26.0–34.0)
MCHC: 35.2 g/dL (ref 30.0–36.0)
MCV: 101.1 fL — ABNORMAL HIGH (ref 80.0–100.0)
Platelets: 210 10*3/uL (ref 150–400)
RBC: 2.78 MIL/uL — ABNORMAL LOW (ref 4.22–5.81)
RDW: 17.4 % — ABNORMAL HIGH (ref 11.5–15.5)
WBC: 8.8 10*3/uL (ref 4.0–10.5)
nRBC: 0 % (ref 0.0–0.2)

## 2022-02-27 LAB — RETICULOCYTES
Immature Retic Fract: 19.2 % — ABNORMAL HIGH (ref 2.3–15.9)
RBC.: 2.76 MIL/uL — ABNORMAL LOW (ref 4.22–5.81)
Retic Count, Absolute: 83.4 10*3/uL (ref 19.0–186.0)
Retic Ct Pct: 3 % (ref 0.4–3.1)

## 2022-02-27 LAB — COMPREHENSIVE METABOLIC PANEL
ALT: 178 U/L — ABNORMAL HIGH (ref 0–44)
AST: 129 U/L — ABNORMAL HIGH (ref 15–41)
Albumin: 2.5 g/dL — ABNORMAL LOW (ref 3.5–5.0)
Alkaline Phosphatase: 416 U/L — ABNORMAL HIGH (ref 38–126)
Anion gap: 8 (ref 5–15)
BUN: 29 mg/dL — ABNORMAL HIGH (ref 8–23)
CO2: 20 mmol/L — ABNORMAL LOW (ref 22–32)
Calcium: 8.2 mg/dL — ABNORMAL LOW (ref 8.9–10.3)
Chloride: 105 mmol/L (ref 98–111)
Creatinine, Ser: 1.21 mg/dL (ref 0.61–1.24)
GFR, Estimated: 57 mL/min — ABNORMAL LOW (ref >60)
Glucose, Bld: 148 mg/dL — ABNORMAL HIGH (ref 70–99)
Potassium: 4.1 mmol/L (ref 3.5–5.1)
Sodium: 133 mmol/L — ABNORMAL LOW (ref 135–145)
Total Bilirubin: 7.8 mg/dL — ABNORMAL HIGH (ref 0.3–1.2)
Total Protein: 5.1 g/dL — ABNORMAL LOW (ref 6.5–8.1)

## 2022-02-27 LAB — PHOSPHORUS: Phosphorus: 3 mg/dL (ref 2.5–4.6)

## 2022-02-27 LAB — IRON AND TIBC
Iron: 100 ug/dL (ref 45–182)
Saturation Ratios: 49 % — ABNORMAL HIGH (ref 17.9–39.5)
TIBC: 204 ug/dL — ABNORMAL LOW (ref 250–450)
UIBC: 104 ug/dL

## 2022-02-27 LAB — VITAMIN B12: Vitamin B-12: 2003 pg/mL — ABNORMAL HIGH (ref 180–914)

## 2022-02-27 LAB — MAGNESIUM: Magnesium: 2.1 mg/dL (ref 1.7–2.4)

## 2022-02-27 LAB — FERRITIN: Ferritin: 697 ng/mL — ABNORMAL HIGH (ref 24–336)

## 2022-02-27 LAB — FOLATE: Folate: 6.5 ng/mL (ref >5.9)

## 2022-02-27 MED ORDER — DIPHENHYDRAMINE HCL 25 MG PO CAPS: 25.0000 mg | ORAL_CAPSULE | Freq: Three times a day (TID) | ORAL | Status: DC | PRN

## 2022-02-27 MED ORDER — LIDOCAINE VISCOUS HCL 2 % MT SOLN
15.0000 mL | OROMUCOSAL | Status: DC | PRN
  Filled 2022-02-27 (×2): qty 15

## 2022-02-27 MED ORDER — ALUM & MAG HYDROXIDE-SIMETH 200-200-20 MG/5ML PO SUSP
30.0000 mL | ORAL | Status: DC | PRN
  Filled 2022-02-27: qty 30

## 2022-02-27 NOTE — Progress Notes (Addendum)
?PROGRESS NOTE ? ?Eric [name] ERX:[id number] DOB: 1929-04-22  ? ?PCP: Irish Lack., PA-C ? ?Patient is from: Home.  Lives with his wife.  Uses walker at baseline. ? ?DOA: 02/23/2022 LOS: 3 ? ?Chief complaints ?Chief Complaint  ?Patient presents with  ? Pruritis  ?  ? ?Brief Narrative / Interim history: ?88 year old M with PMH of colon rectal cancer, CKD-3B, HTN and BPH presenting with dark urine and pruritus for 4 days and about 10 pound unintentional weight loss in the month, and admitted for obstructive jaundice with elevated liver enzymes and hyperbilirubinemia.  CT A/P with several small scattered calcified liver granuloma, mild biliary ductal dilation but no calcified gallstone or pericholecystic fluid.  RUQ [place] with cholelithiasis without acute cholecystitis.  GI consulted.  MRCP with cholelithiasis, mild increase in caliber of CBD and main pancreatic duct up to the level of ampulla but no choledocholithiasis or obstructing mass.  ? ?Patient underwent ERCP on 3/17 but not able to cannulate duct.  Biopsies were taken.  Plan for repeat EUS/ERCP on 3/20.   ? ?Subjective: ?Seen and examined earlier this morning.  No major events overnight of this morning.  No complaints.  Reports improvement in his urine color and pruritus.  Denies abdominal pain, nausea or vomiting.  Patient's wife at bedside. ? ?Objective: ?Vitals:  ? 02/27/22 0512 02/27/22 0622 02/27/22 0854 02/27/22 1300  ?BP: (!) 91/50 (!) 108/55 (!) 96/50 (!) 103/57  ?Pulse: 68 60 79 76  ?Resp: '18 18 18 18  '$ ?Temp: 97.8 ?F (36.6 ?C) 97.7 ?F (36.5 ?C) 97.7 ?F (36.5 ?C) 97.8 ?F (36.6 ?C)  ?TempSrc: Oral Oral Oral Oral  ?SpO2: 100% 96% 99% 100%  ?Weight:      ?Height:      ? ? ?Examination: ? ?GENERAL: No apparent distress.  Nontoxic. ?HEENT: MMM.  Sclera icteric. ?NECK: Supple.  No apparent JVD.  ?RESP:  No IWOB.  Fair aeration bilaterally. ?CVS:  RRR. Heart sounds normal.  ?ABD/GI/GU: BS+. Abd soft, NTND.  ?MSK/EXT:  Moves extremities. No apparent  deformity. No edema.  ?SKIN: Skin jaundice. ?NEURO: Awake and alert. Oriented appropriately.  No apparent focal neuro deficit. ?PSYCH: Calm. Normal affect.  ? ?Procedures:  ?3/17-ERCP ? ?Microbiology summarized: ?COVID-19 and influenza PCR nonreactive. ?Urine culture with insignificant growth. ? ?Assessment and Plan: ?* Painless obstructive jaundice ?MRCP with cholelithiasis, mild increased caliber of CBD and main pancreatic duct up to the level of the ampulla but no choledocholithiasis or obstructing mass. ERCP on 3/17 but not able to cannulate duct.  Biopsies were taken.   ?Recent Labs  ?Lab 02/23/22 ?2111 02/24/22 ?1021 02/25/22 ?9509 02/26/22 ?0501  ?AST 153* 159* 161* 185*  ?ALT 240* 216* 188* 185*  ?ALKPHOS 438* 413* 386* 371*  ?BILITOT 11.2* 9.8* 10.2* 10.1*  ?PROT 6.0* 5.3* 5.1* 4.9*  ?ALBUMIN 3.0* 2.8* 2.6* 2.5*  ?-Plan for repeat EUS/ERCP on 3/20.  ?-Continue cholestyramine for pruritus. ? ?Hyperbilirubinemia ?Improving.  See obstructive jaundice ?-Continue cholestyramine ? ?Mass of ampulla of Vater ?Follow-up biopsy ? ?Elevated LFTs ?See obstructive jaundice ? ?Macrocytic anemia ?Recent Labs  ?  02/23/22 ?2111 02/24/22 ?1021 02/25/22 ?3267 02/26/22 ?0501 02/27/22 ?0530  ?HGB 11.7* 11.0* 10.5* 9.4* 9.9*  ?H&H stable after initial drop likely dilutional.  Anemia panel consistent with anemia of chronic disease. ?-Monitor ? ?Hyponatremia ?Stable. ?-Continue monitoring ? ?Bacteriuria with pyuria ?Patient denies UTI symptoms.  Started on IV ceftriaxone.  Urine culture with insignificant growth.  Received ceftriaxone for 2 days.  UTI ruled out. ?-  Discontinued ceftriaxone. ? ?Elevated lipase ?Mild.  Recheck in the morning ? ?Malnutrition of moderate degree ?Nutrition Status: ?Nutrition Problem: Moderate Malnutrition ?Etiology: acute illness (obstructive jaundice) ?Signs/Symptoms: energy intake < 75% for > 7 days, moderate fat depletion, moderate muscle depletion ?Interventions: Boost Breeze, MVI ? ?Class II  obesity ?Body mass index is 35.29 kg/m?.  ? ?Metabolic acidosis ?Likely due to renal failure and IV fluid. ?-Continue monitoring ? ?History of colorectal cancer ?Outpatient follow-up. ? ?HTN (hypertension) ?Normotensive but soft. ?-Continue holding home lisinopril and torsemide ? ?Benign prostatic hyperplasia ?Not on medication. ?-Monitor urine output ? 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  02/23/22 ?2111 02/24/22 ?1021 02/25/22 ?8119 02/26/22 ?0501 02/27/22 ?0530  ?BUN '22 21 22 '$ 26* 29*  ?CREATININE 1.45* 1.36* 1.29* 1.22 1.21  ?-Monitor ? ? ? ?  ?DVT prophylaxis:  ?SCDs Start: 02/24/22 1021 ? ?Code Status: Full code ?Family Communication: Updated patient's wife at bedside ?Level of care: Telemetry ?Status is: Inpatient ?Remains inpatient appropriate because: Obstructive jaundice with elevated liver enzymes and hyperbilirubinemia requiring further evaluation/EUS/repeat ERCP ? ? ?Final disposition: Home with home health once medically cleared ? ?Consultants:  ?Gastroenterology ? ?Sch Meds:  ?Scheduled Meds: ? cholestyramine light  4 g Oral BID  ? feeding supplement  1 Container Oral TID BM  ? multivitamin with minerals  1 tablet Oral Daily  ? vitamin B-12  100 mcg Oral Daily  ? ?Continuous Infusions: ? sodium chloride 100 mL/hr at 02/26/22 0317  ? ?PRN Meds:.acetaminophen **OR** acetaminophen, alum & mag hydroxide-simeth **AND** lidocaine, diphenhydrAMINE, lip balm, ondansetron **OR** ondansetron (ZOFRAN) IV, oxyCODONE ? ?Antimicrobials: ?Anti-infectives (From admission, onward)  ? ? Start     Dose/Rate Route Frequency Ordered Stop  ? 02/24/22 2200  cefTRIAXone (ROCEPHIN) 2 g in sodium chloride 0.9 % 100 mL IVPB  Status:  Discontinued       ? 2 g ?200 mL/hr over 30 Minutes Intravenous Every 24 hours 02/24/22 1025 02/26/22 1007  ? 02/24/22 0030  cefTRIAXone (ROCEPHIN) 1 g in sodium chloride 0.9 % 100 mL IVPB       ? 1 g ?200 mL/hr over 30 Minutes Intravenous  Once  02/24/22 0017 02/24/22 0100  ? ?  ? ? ? ?I have personally reviewed the following labs and images: ?CBC: ?Recent Labs  ?Lab 02/23/22 ?2111 02/24/22 ?1021 02/25/22 ?1478 02/26/22 ?0501 02/27/22 ?0530  ?WBC 5.6 6.6 6.2 6.9 8.8  ?NEUTROABS 4.1 5.0  --   --   --   ?HGB 11.7* 11.0* 10.5* 9.4* 9.9*  ?HCT 33.4* 32.1* 30.1* 27.9* 28.1*  ?MCV 100.6* 103.2* 101.7* 102.6* 101.1*  ?PLT 262 232 227 218 210  ? ?BMP &GFR ?Recent Labs  ?Lab 02/23/22 ?2111 02/24/22 ?1021 02/25/22 ?2956 02/26/22 ?0501 02/27/22 ?0530  ?NA 131* 132* 134* 135 133*  ?K 3.9 4.2 4.2 4.1 4.1  ?CL 100 104 105 108 105  ?CO2 21* 21* 20* 20* 20*  ?GLUCOSE 116* 102* 92 106* 148*  ?BUN '22 21 22 '$ 26* 29*  ?CREATININE 1.45* 1.36* 1.29* 1.22 1.21  ?CALCIUM 8.5* 8.4* 8.3* 8.2* 8.2*  ?MG  --   --   --  2.1 2.1  ?PHOS  --   --   --  3.2 3.0  ? ?Estimated Creatinine Clearance: 49.2 mL/min (by C-G formula based on SCr of 1.21 mg/dL). ?Liver & Pancreas: ?Recent Labs  ?Lab 02/23/22 ?2111 02/24/22 ?1021 02/25/22 ?2130 02/26/22 ?0501 02/27/22 ?0530  ?AST 153* 159* 161*  185* 129*  ?ALT 240* 216* 188* 185* 178*  ?ALKPHOS 438* 413* 386* 371* 416*  ?BILITOT 11.2* 9.8* 10.2* 10.1* 7.8*  ?PROT 6.0* 5.3* 5.1* 4.9* 5.1*  ?ALBUMIN 3.0* 2.8* 2.6* 2.5* 2.5*  ? ?Recent Labs  ?Lab 02/23/22 ?2111 02/25/22 ?4665 02/26/22 ?0501  ?LIPASE 112* 61* 76*  ? ?No results for input(s): AMMONIA in the last 168 hours. ?Diabetic: ?No results for input(s): HGBA1C in the last 72 hours. ?No results for input(s): GLUCAP in the last 168 hours. ?Cardiac Enzymes: ?No results for input(s): CKTOTAL, CKMB, CKMBINDEX, TROPONINI in the last 168 hours. ?No results for input(s): PROBNP in the last 8760 hours. ?Coagulation Profile: ?Recent Labs  ?Lab 02/24/22 ?1243  ?INR 1.0  ? ?Thyroid Function Tests: ?No results for input(s): TSH, T4TOTAL, FREET4, T3FREE, THYROIDAB in the last 72 hours. ?Lipid Profile: ?No results for input(s): CHOL, HDL, LDLCALC, TRIG, CHOLHDL, LDLDIRECT in the last 72 hours. ?Anemia  Panel: ?Recent Labs  ?  02/26/22 ?0501 02/27/22 ?0530  ?[id number]  --  2,003*  ?FOLATE 6.7 6.5  ?FERRITIN  --  697*  ?TIBC  --  204*  ?IRON  --  100  ?RETICCTPCT  --  3.0  ? ?Urine analysis: ?   ?Component Value Date/Time  ?

## 2022-02-27 NOTE — Progress Notes (Addendum)
? ? Progress Note ? ? Subjective  ?Day #4 ? ?Chief Complaint: Painless jaundice and elevated LFTs ? ?Status post ERCP 02/26/2022 with inability to cannulate duct due to it being in a diverticulum, biopsies were taken but have not resulted. ? ?Today, the patient tells me that he is having some trouble with a sore throat this morning, especially when he is trying to eat his breakfast.  Tells me he also had trouble when he "got down into a hole" in his bed and apparently felt short of breath.  Now that he is sitting in his bedside chair and has been walked around by physical therapy he is feeling some better.  He asked me when I think he will be able to go home.  Tells me his itching is some better and his urine looked a lighter color today. ? ? Objective  ? ?Vital signs in last 24 hours: ?Temp:  [97.6 ?F (36.4 ?C)-98.3 ?F (36.8 ?C)] 97.7 ?F (36.5 ?C) (03/18 3818) ?Pulse Rate:  [60-113] 79 (03/18 0854) ?Resp:  [11-42] 18 (03/18 0854) ?BP: (91-178)/(50-151) 96/50 (03/18 0854) ?SpO2:  [95 %-100 %] 99 % (03/18 0854) ?Weight:  [108.4 kg] 108.4 kg (03/17 1423) ?Last BM Date : 02/25/22 ?General:    Elderly jaundiced Caucasian male in NAD ?Heart:  Regular rate and rhythm; no murmurs ?Lungs: Respirations even and unlabored, lungs CTA bilaterally ?Abdomen:  Soft, nontender and nondistended. Normal bowel sounds. ?Psych:  Cooperative. Normal mood and affect. ? ?Intake/Output from previous day: ?03/17 0701 - 03/18 0700 ?In: 600 [I.V.:600] ?Out: 400 [Urine:400] ? ? ?Lab Results: ?Recent Labs  ?  02/25/22 ?2993 02/26/22 ?0501 02/27/22 ?0530  ?WBC 6.2 6.9 8.8  ?HGB 10.5* 9.4* 9.9*  ?HCT 30.1* 27.9* 28.1*  ?PLT 227 218 210  ? ?BMET ?Recent Labs  ?  02/25/22 ?7169 02/26/22 ?0501 02/27/22 ?0530  ?NA 134* 135 133*  ?K 4.2 4.1 4.1  ?CL 105 108 105  ?CO2 20* 20* 20*  ?GLUCOSE 92 106* 148*  ?BUN 22 26* 29*  ?CREATININE 1.29* 1.22 1.21  ?CALCIUM 8.3* 8.2* 8.2*  ? ?Hepatic Function Latest Ref Rng & Units 02/27/2022 02/26/2022 02/25/2022  ?Total  Protein 6.5 - 8.1 g/dL 5.1(L) 4.9(L) 5.1(L)  ?Albumin 3.5 - 5.0 g/dL 2.5(L) 2.5(L) 2.6(L)  ?AST 15 - 41 U/L 129(H) 185(H) 161(H)  ?ALT 0 - 44 U/L 178(H) 185(H) 188(H)  ?Alk Phosphatase 38 - 126 U/L 416(H) 371(H) 386(H)  ?Total Bilirubin 0.3 - 1.2 mg/dL 7.8(H) 10.1(H) 10.2(H)  ?  ? ?PT/INR ?Recent Labs  ?  02/24/22 ?1243  ?LABPROT 13.0  ?INR 1.0  ? ? ?ERCP 02/26/2022 ?Findings: ?     1. The stomach was unremarkable (H/H). The duodenum was unremarkable ?     2. The ampulla sat within a diverticulum. It wasprominent frondy and  ?     friable but not mass like. Multiple biopsies taken after failed  ?     cannulation. ?     3. The scout film was normal. ?     4, Failed cannulation after a number of attempts due to deep seeded  ?     somewhat abnormal ampulla with contact bleeding ?Impression:                ?1. The stomach was unremarkable (H/H). The duodenum  ?                          was unremarkable ?  2. The ampulla sat within a diverticulum. It  ?                          wasprominent frondy and friable but not mass like.  ?                          Multiple biopsies taken after failed cannulation. ?                          3. The scout film was normal. ?                          4, Failed cannulation after a number of attempts  ?                          due to deep seeded somewhat abnormal ampulla with  ?                          contact bleeding ?Recommendation:            ?1. Follow up biopsies ?                          2. Confer with EUS colleagues ?                          3. Next step EUS/ERCP vs. PTC. ? ? Assessment / Plan:   ?Assessment: ?1.  Painless jaundice with elevated LFTs: LFTs decreasing overnight, ERCP yesterday as above, patient does feel some better today with decreased itching and lightening of his urine, biopsies pending, EUS planned for Monday ?2.  History of adenocarcinoma of the rectum ?3.  CKD ?4.  Mild anemia ? ?Plan: ?1.  ERCP yesterday with findings as above,  Dr. Henrene Pastor has communicated with Dr. Rush Landmark who was going to try and repeat an EUS/ERCP on Monday, 03/01/2022. ?2.  I communicated with the pathologist on-call to try and rush biopsies from ERCP yesterday. ?3.  Continue current diet ?4.  Ordered viscous lidocaine for the patient given his sore throat, he can use this as needed, likely prior to eating today to ease discomfort ?5.  Discussed with patient that he will need to stay over the weekend ?6.  Continue to monitor LFTs ? ? ?Thank you for your kind consultation, we will continue to follow. ? ? ? LOS: 3 days  ? ?Lavone Nian [facility]  02/27/2022, 9:47 AM ? ?GI ATTENDING ? ?Interval history data reviewed.  Patient personally seen and examined.  We have requested a rush from pathology on yesterday's biopsies.  I spoke with Dr. Rush Landmark and he is willing to proceed with EUS/ERCP Monday afternoon.  Spoke with endoscopy and they have a case posted for 2:45 PM.  Patient and family aware.  Will order as needed Benadryl for pruritus ? ?Docia Chuck. Geri Seminole., M.D. ?[facility] ?Division of Gastroenterology  ? ? ? ?  ?

## 2022-02-27 NOTE — Assessment & Plan Note (Deleted)
Follow-up biopsy ?

## 2022-02-27 NOTE — Progress Notes (Signed)
Occupational Therapy Treatment ?Patient Details ?Name: Eric [name] ?MRN: [id number] ?DOB: [birth year] ?Today's Date: 02/27/2022 ? ? ?History of present illness Patient is a 88 year old male admitted with 4 days of dark urine and pruritis. Dx of obstructive jaundice.  MRCP on 3/15, possible ERCP 3/16. PMH: HTN, BPH, colorectal cancer ?  ?OT comments ? Patient was motivated to participate in session with patient able to complete bathing tasks seated with set up with safety cues for IV lines. Patient reported itchiness was less on this date. Patient was noted to have BP on lower side on this date with 119/53 mmhg after seated bathing tasks, and 99/59mhg in standing. Patient reported no change in status with standing. Session was cut short with GI PA in room to meet with patient. Nursing team made aware of patients need to use bathroom when done with PA. Patient would continue to benefit from skilled OT services at this time while admitted and after d/c to address noted deficits in order to improve overall safety and independence in ADLs.  ?  ? ?Recommendations for follow up therapy are one component of a multi-disciplinary discharge planning process, led by the attending physician.  Recommendations may be updated based on patient status, additional functional criteria and insurance authorization. ?   ?Follow Up Recommendations ? Home health OT  ?  ?Assistance Recommended at Discharge Intermittent Supervision/Assistance  ?Patient can return home with the following ? A little help with walking and/or transfers;A little help with bathing/dressing/bathroom;Assistance with cooking/housework;Assist for transportation;Help with stairs or ramp for entrance ?  ?Equipment Recommendations ? None recommended by OT  ?  ?Recommendations for Other Services   ? ?  ?Precautions / Restrictions Precautions ?Precautions: Fall ?Restrictions ?Weight Bearing Restrictions: No  ? ? ?  ? ?Mobility Bed Mobility ?  ?  ?  ?  ?  ?  ?  ?General  bed mobility comments: patient was up in recliner at start of session ?  ? ?Transfers ?  ?  ?  ?  ?  ?  ?  ?  ?  ?  ?  ?  ?Balance Overall balance assessment: Needs assistance ?Sitting-balance support: Feet supported ?Sitting balance-Leahy Scale: Good ?  ?  ?  ?  ?  ?  ?  ?  ?  ?  ?  ?  ?  ?  ?  ?  ?   ? ?ADL either performed or assessed with clinical judgement  ? ?ADL Overall ADL's : Needs assistance/impaired ?  ?  ?Grooming: Set up;Sitting;Wash/dry face ?  ?Upper Body Bathing: Set up;Sitting ?Upper Body Bathing Details (indicate cue type and reason): in recliner ?Lower Body Bathing: Minimal assistance;Sitting/lateral leans;Sit to/from stand ?Lower Body Bathing Details (indicate cue type and reason): in recliner ?Upper Body Dressing : Set up;Sitting ?  ?  ?  ?  ?Toilet Transfer Details (indicate cue type and reason): patient attempted to go to bathroom with GI PA entering room to speak with patient. patient reported wanting to speak with MD at this time. session was ended. nursing made aware of patietns needs when MD has left. ?  ?  ?  ?  ?  ?  ?  ? ?Extremity/Trunk Assessment   ?  ?  ?  ?  ?  ? ?Vision   ?  ?  ?Perception   ?  ?Praxis   ?  ? ?Cognition Arousal/Alertness: Awake/alert ?Behavior During Therapy: [facility] tasks assessed/performed ?Overall Cognitive Status: Within Functional Limits for  tasks assessed ?  ?  ?  ?  ?  ?  ?  ?  ?  ?  ?  ?  ?  ?  ?  ?  ?  ?  ?  ?   ?Exercises   ? ?  ?Shoulder Instructions   ? ? ?  ?General Comments    ? ? ?Pertinent Vitals/ Pain       Pain Assessment ?Pain Assessment: No/denies pain ? ?Home Living   ?  ?  ?  ?  ?  ?  ?  ?  ?  ?  ?  ?  ?  ?  ?  ?  ?  ?  ? ?  ?Prior Functioning/Environment    ?  ?  ?  ?   ? ?Frequency ? Min 2X/week  ? ? ? ? ?  ?Progress Toward Goals ? ?OT Goals(current goals can now be found in the care plan section) ? Progress towards OT goals: Progressing toward goals ? ?   ?Plan Discharge plan remains appropriate   ? ?Co-evaluation ? ? ?   ?  ?  ?  ?  ? ?   ?AM-PAC OT "6 Clicks" Daily Activity     ?Outcome Measure ? ? Help from another person eating meals?: Total (NPO) ?Help from another person taking care of personal grooming?: A Little ?Help from another person toileting, which includes using toliet, bedpan, or urinal?: A Lot ?Help from another person bathing (including washing, rinsing, drying)?: A Little ?Help from another person to put on and taking off regular upper body clothing?: A Little ?Help from another person to put on and taking off regular lower body clothing?: A Little ?6 Click Score: 15 ? ?  ?End of Session Equipment Utilized During Treatment: Rolling walker (2 wheels) ? ?OT Visit Diagnosis: Unsteadiness on feet (R26.81) ?  ?Activity Tolerance Patient tolerated treatment well ?  ?Patient Left in chair;with call bell/phone within reach;with family/visitor present ?  ?Nurse Communication Mobility status ?  ? ?   ? ?Time: 3500-9381 ?OT Time Calculation (min): 23 min ? ?Charges: OT General Charges ?$OT Visit: 1 Visit ?OT Treatments ?$Self Care/Home Management : 23-37 mins ? ?Karn Derk OTR/L, MS ?Acute Rehabilitation Department ?Office# [phone] ?Pager# [phone] ? ? ?[place] ?02/27/2022, 9:52 AM ?

## 2022-02-28 DIAGNOSIS — R7989 Other specified abnormal findings of blood chemistry: Secondary | ICD-10-CM | POA: Diagnosis not present

## 2022-02-28 DIAGNOSIS — R17 Unspecified jaundice: Principal | ICD-10-CM

## 2022-02-28 DIAGNOSIS — K219 Gastro-esophageal reflux disease without esophagitis: Secondary | ICD-10-CM

## 2022-02-28 DIAGNOSIS — K831 Obstruction of bile duct: Secondary | ICD-10-CM | POA: Diagnosis not present

## 2022-02-28 DIAGNOSIS — R748 Abnormal levels of other serum enzymes: Secondary | ICD-10-CM | POA: Diagnosis not present

## 2022-02-28 DIAGNOSIS — D72829 Elevated white blood cell count, unspecified: Secondary | ICD-10-CM

## 2022-02-28 LAB — COMPREHENSIVE METABOLIC PANEL
ALT: 158 U/L — ABNORMAL HIGH (ref 0–44)
AST: 112 U/L — ABNORMAL HIGH (ref 15–41)
Albumin: 2.4 g/dL — ABNORMAL LOW (ref 3.5–5.0)
Alkaline Phosphatase: 339 U/L — ABNORMAL HIGH (ref 38–126)
Anion gap: 8 (ref 5–15)
BUN: 36 mg/dL — ABNORMAL HIGH (ref 8–23)
CO2: 19 mmol/L — ABNORMAL LOW (ref 22–32)
Calcium: 8 mg/dL — ABNORMAL LOW (ref 8.9–10.3)
Chloride: 107 mmol/L (ref 98–111)
Creatinine, Ser: 1.42 mg/dL — ABNORMAL HIGH (ref 0.61–1.24)
GFR, Estimated: 47 mL/min — ABNORMAL LOW (ref >60)
Glucose, Bld: 118 mg/dL — ABNORMAL HIGH (ref 70–99)
Potassium: 4.3 mmol/L (ref 3.5–5.1)
Sodium: 134 mmol/L — ABNORMAL LOW (ref 135–145)
Total Bilirubin: 8.5 mg/dL — ABNORMAL HIGH (ref 0.3–1.2)
Total Protein: 4.8 g/dL — ABNORMAL LOW (ref 6.5–8.1)

## 2022-02-28 LAB — MAGNESIUM: Magnesium: 2.2 mg/dL (ref 1.7–2.4)

## 2022-02-28 LAB — CBC
HCT: 26 % — ABNORMAL LOW (ref 39.0–52.0)
Hemoglobin: 8.9 g/dL — ABNORMAL LOW (ref 13.0–17.0)
MCH: 35.2 pg — ABNORMAL HIGH (ref 26.0–34.0)
MCHC: 34.2 g/dL (ref 30.0–36.0)
MCV: 102.8 fL — ABNORMAL HIGH (ref 80.0–100.0)
Platelets: 211 10*3/uL (ref 150–400)
RBC: 2.53 MIL/uL — ABNORMAL LOW (ref 4.22–5.81)
RDW: 18.5 % — ABNORMAL HIGH (ref 11.5–15.5)
WBC: 12.4 10*3/uL — ABNORMAL HIGH (ref 4.0–10.5)
nRBC: 0 % (ref 0.0–0.2)

## 2022-02-28 LAB — LIPASE, BLOOD: Lipase: 278 U/L — ABNORMAL HIGH (ref 11–51)

## 2022-02-28 LAB — PHOSPHORUS: Phosphorus: 3.7 mg/dL (ref 2.5–4.6)

## 2022-02-28 MED ORDER — SODIUM BICARBONATE 650 MG PO TABS
650.0000 mg | ORAL_TABLET | Freq: Three times a day (TID) | ORAL | Status: DC
  Administered 2022-02-28 – 2022-03-03 (×8): 650 mg via ORAL
  Filled 2022-02-28 (×8): qty 1

## 2022-02-28 MED ORDER — PANTOPRAZOLE SODIUM 40 MG PO TBEC
40.0000 mg | DELAYED_RELEASE_TABLET | Freq: Every day | ORAL | Status: DC
  Administered 2022-02-28: 40 mg via ORAL
  Filled 2022-02-28: qty 1

## 2022-02-28 MED ORDER — LACTATED RINGERS IV BOLUS: 1000.0000 mL | Freq: Once | INTRAVENOUS | Status: DC

## 2022-02-28 MED ORDER — SODIUM CHLORIDE 0.9 % IV BOLUS
500.0000 mL | Freq: Once | INTRAVENOUS | Status: AC
  Administered 2022-02-28: 500 mL via INTRAVENOUS

## 2022-02-28 NOTE — Progress Notes (Addendum)
?PROGRESS NOTE ? ?[name] JGO:[id number] DOB: June 04, 1929  ? ?PCP: Irish Lack., PA-C ? ?Patient is from: Home.  Lives with his wife.  Uses walker at baseline. ? ?DOA: 02/23/2022 LOS: 4 ? ?Chief complaints ?Chief Complaint  ?Patient presents with  ? Pruritis  ?  ? ?Brief Narrative / Interim history: ?88 year old M with PMH of colon rectal cancer, CKD-3B, HTN and BPH presenting with dark urine and pruritus for 4 days and about 10 pound unintentional weight loss in the month, and admitted for obstructive jaundice with elevated liver enzymes and hyperbilirubinemia.  CT A/P with several small scattered calcified liver granuloma, mild biliary ductal dilation but no calcified gallstone or pericholecystic fluid.  RUQ [place] with cholelithiasis without acute cholecystitis.  GI consulted.  MRCP with cholelithiasis, mild increase in caliber of CBD and main pancreatic duct up to the level of ampulla but no choledocholithiasis or obstructing mass.  ? ?Patient underwent ERCP on 3/17 but not able to cannulate duct.  Biopsies were taken.  Plan for repeat EUS/ERCP on 3/20.   ? ?Subjective: ?Seen and examined earlier this morning.  No major events overnight of this morning.  No specific complaints other than heartburn and reflux.  Denies chest pain, dyspnea, cough, GI or UTI symptoms.  Patient's wife and daughter at bedside. ? ?Objective: ?Vitals:  ? 02/27/22 1300 02/27/22 1933 02/28/22 0500 02/28/22 0856  ?BP: (!) 103/57 (!) 108/58 (!) 117/52   ?Pulse: 76 77 74   ?Resp: '18 16 18   '$ ?Temp: 97.8 ?F (36.6 ?C) 98.3 ?F (36.8 ?C) 98 ?F (36.7 ?C)   ?TempSrc: Oral Oral Oral   ?SpO2: 100% 98% 99% 100%  ?Weight:      ?Height:      ? ? ?Examination: ? ?GENERAL: No apparent distress.  Nontoxic. ?HEENT: MMM.  Sclerae icteric. ?NECK: Supple.  No apparent JVD.  ?RESP:  No IWOB.  Fair aeration bilaterally. ?CVS:  RRR. Heart sounds normal.  ?ABD/GI/GU: BS+. Abd soft, NTND.  ?MSK/EXT:  Moves extremities. No apparent deformity. No edema.  ?SKIN:  Skin jaundice. ?NEURO: Awake and alert. Oriented appropriately.  No apparent focal neuro deficit. ?PSYCH: Calm. Normal affect.  ? ?Procedures:  ?3/17-ERCP ? ?Microbiology summarized: ?COVID-19 and influenza PCR nonreactive. ?Urine culture with insignificant growth. ? ?Assessment and Plan: ?* Painless obstructive jaundice ?MRCP with cholelithiasis, mild increased caliber of CBD and main pancreatic duct up to the level of the ampulla but no choledocholithiasis or obstructing mass. ERCP on 3/17 but not able to cannulate duct.  Biopsies were taken and pending.   ?Recent Labs  ?Lab 02/24/22 ?1021 02/25/22 ?5597 02/26/22 ?0501 02/27/22 ?0530 02/28/22 ?4163  ?AST 159* 161* 185* 129* 112*  ?ALT 216* 188* 185* 178* 158*  ?ALKPHOS 413* 386* 371* 416* 339*  ?BILITOT 9.8* 10.2* 10.1* 7.8* 8.5*  ?PROT 5.3* 5.1* 4.9* 5.1* 4.8*  ?ALBUMIN 2.8* 2.6* 2.5* 2.5* 2.4*  ?-Plan for repeat EUS/ERCP on 3/20.  ?-Continue cholestyramine for pruritus. ? ?Hyperbilirubinemia ?See obstructive jaundice ?-Continue cholestyramine ? ?Mass of ampulla of Vater ?Follow-up biopsy ? ?Elevated LFTs ?See obstructive jaundice ? ?Macrocytic anemia ?Recent Labs  ?  02/23/22 ?2111 02/24/22 ?1021 02/25/22 ?8453 02/26/22 ?0501 02/27/22 ?0530 02/28/22 ?6468  ?HGB 11.7* 11.0* 10.5* 9.4* 9.9* 8.9*  ?H&H stable after initial drop likely dilutional.  Anemia panel consistent with anemia of chronic disease.  Denies melena or hematochezia. ?-Monitor ? ?Bacteriuria with pyuria ?Patient denies UTI symptoms.  Started on IV ceftriaxone.  Urine culture with insignificant growth.  Received ceftriaxone for 2 days.  UTI ruled out. ?-Discontinued ceftriaxone. ? ?Elevated lipase ?Trended up.  Post ERCP pancreatitis? ?-Recheck in the morning ? ?GERD (gastroesophageal reflux disease) ?Started p.o. Protonix. ?GI cocktail as needed. ? ?Leukocytosis ?No clear source of infection yet.  Demargination? ?-Check differential ?-Closely monitor for signs of infection ? ?Malnutrition of  moderate degree ?Nutrition Status: ?Nutrition Problem: Moderate Malnutrition ?Etiology: acute illness (obstructive jaundice) ?Signs/Symptoms: energy intake < 75% for > 7 days, moderate fat depletion, moderate muscle depletion ?Interventions: Boost Breeze, MVI ? ?Class II obesity ?Body mass index is 35.29 kg/m?.  ? ?Metabolic acidosis ?Likely due to renal failure and IV fluid. ?Start sodium bicarbonate ? ?History of colorectal cancer ?Outpatient follow-up. ? ?Hyponatremia ?Recent Labs  ?Lab 02/23/22 ?2111 02/24/22 ?1021 02/25/22 ?2836 02/26/22 ?0501 02/27/22 ?0530 02/28/22 ?6294  ?NA 131* 132* 134* 135 133* 134*  ?-Improving ?-IV fluid as above. ? ? ?HTN (hypertension) ?Normotensive but soft. ?-Continue holding home lisinopril and torsemide ? ?Benign prostatic hyperplasia ?Not on medication. ?-Monitor urine output ? 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  02/23/22 ?2111 02/24/22 ?1021 02/25/22 ?7654 02/26/22 ?0501 02/27/22 ?0530 02/28/22 ?6503  ?BUN '22 21 22 '$ 26* 29* 36*  ?CREATININE 1.45* 1.36* 1.29* 1.22 1.21 1.42*  ?Creatinine slightly up today.  Likely from poor p.o. intake and mild hypotension. ?-NS bolus 500 cc x 1 followed by NS 100 cc an hour ?-Avoid nephrotoxic meds ?-Recheck in the morning ? ? ? ?  ?DVT prophylaxis:  ?SCDs Start: 02/24/22 1021 ? ?Code Status: Full code ?Family Communication: Updated patient's wife and daughter at bedside ?Level of care: Telemetry ?Status is: Inpatient ?Remains inpatient appropriate because: Obstructive jaundice with elevated liver enzymes and hyperbilirubinemia requiring further evaluation/EUS/repeat ERCP ? ? ?Final disposition: Home with home health once medically cleared ? ?Consultants:  ?Gastroenterology ? ?Sch Meds:  ?Scheduled Meds: ? cholestyramine light  4 g Oral BID  ? feeding supplement  1 Container Oral TID BM  ? multivitamin with minerals  1 tablet Oral Daily  ? pantoprazole  40 mg Oral Daily  ? sodium bicarbonate   650 mg Oral TID  ? vitamin B-12  100 mcg Oral Daily  ? ?Continuous Infusions: ? sodium chloride 100 mL/hr at 02/26/22 0317  ? ?PRN Meds:.acetaminophen **OR** acetaminophen, alum & mag hydroxide-simeth **AND** lidocaine, diphenhydrAMINE, lip balm, ondansetron **OR** ondansetron (ZOFRAN) IV, oxyCODONE ? ?Antimicrobials: ?Anti-infectives (From admission, onward)  ? ? Start     Dose/Rate Route Frequency Ordered Stop  ? 02/24/22 2200  cefTRIAXone (ROCEPHIN) 2 g in sodium chloride 0.9 % 100 mL IVPB  Status:  Discontinued       ? 2 g ?200 mL/hr over 30 Minutes Intravenous Every 24 hours 02/24/22 1025 02/26/22 1007  ? 02/24/22 0030  cefTRIAXone (ROCEPHIN) 1 g in sodium chloride 0.9 % 100 mL IVPB       ? 1 g ?200 mL/hr over 30 Minutes Intravenous  Once 02/24/22 0017 02/24/22 0100  ? ?  ? ? ? ?I have personally reviewed the following labs and images: ?CBC: ?Recent Labs  ?Lab 02/23/22 ?2111 02/24/22 ?1021 02/25/22 ?5465 02/26/22 ?0501 02/27/22 ?0530 02/28/22 ?6812  ?WBC 5.6 6.6 6.2 6.9 8.8 12.4*  ?NEUTROABS 4.1 5.0  --   --   --   --   ?HGB 11.7* 11.0* 10.5* 9.4* 9.9* 8.9*  ?HCT 33.4* 32.1* 30.1* 27.9* 28.1* 26.0*  ?MCV 100.6* 103.2* 101.7* 102.6* 101.1* 102.8*  ?PLT 262 232  227 218 210 211  ? ?BMP &GFR ?Recent Labs  ?Lab 02/24/22 ?1021 02/25/22 ?9326 02/26/22 ?0501 02/27/22 ?0530 02/28/22 ?7124  ?NA 132* 134* 135 133* 134*  ?K 4.2 4.2 4.1 4.1 4.3  ?CL 104 105 108 105 107  ?CO2 21* 20* 20* 20* 19*  ?GLUCOSE 102* 92 106* 148* 118*  ?BUN 21 22 26* 29* 36*  ?CREATININE 1.36* 1.29* 1.22 1.21 1.42*  ?CALCIUM 8.4* 8.3* 8.2* 8.2* 8.0*  ?MG  --   --  2.1 2.1 2.2  ?PHOS  --   --  3.2 3.0 3.7  ? ?Estimated Creatinine Clearance: 42 mL/min (A) (by C-G formula based on SCr of 1.42 mg/dL (H)). ?Liver & Pancreas: ?Recent Labs  ?Lab 02/24/22 ?1021 02/25/22 ?5809 02/26/22 ?0501 02/27/22 ?0530 02/28/22 ?9833  ?AST 159* 161* 185* 129* 112*  ?ALT 216* 188* 185* 178* 158*  ?ALKPHOS 413* 386* 371* 416* 339*  ?BILITOT 9.8* 10.2* 10.1* 7.8* 8.5*   ?PROT 5.3* 5.1* 4.9* 5.1* 4.8*  ?ALBUMIN 2.8* 2.6* 2.5* 2.5* 2.4*  ? ?Recent Labs  ?Lab 02/23/22 ?2111 02/25/22 ?8250 02/26/22 ?0501 02/28/22 ?5397  ?LIPASE 112* 61* 76* 278*  ? ?No results for input(s): AM

## 2022-02-28 NOTE — Assessment & Plan Note (Addendum)
Resolved

## 2022-02-28 NOTE — Assessment & Plan Note (Addendum)
Noted on EUS.  ?-GI increased Protonix to twice daily ?

## 2022-02-28 NOTE — Progress Notes (Addendum)
? ? Progress Note ? ? Subjective  ?Day #5 ? ?Chief Complaint: Painless jaundice and elevated LFTs ? ?Status post ERCP 02/26/2022 with inability to cannulate duct due to it being in a diverticulum, biopsies were taken but have not resulted. ? ?Today, the patient tells me he had a rough night with a lot of reflux symptoms and some epigastric discomfort.  He did manage to eat his breakfast, but is coughing at time of my interview.  Tells me the hospitalist already saw him and told him there and to give him some medicine.  Aware of plans for repeat procedures tomorrow.  Answered all his questions. ? ? Objective  ? ?Vital signs in last 24 hours: ?Temp:  [97.8 ?F (36.6 ?C)-98.3 ?F (36.8 ?C)] 98 ?F (36.7 ?C) (03/19 0500) ?Pulse Rate:  [74-77] 74 (03/19 0500) ?Resp:  [16-18] 18 (03/19 0500) ?BP: (103-117)/(52-58) 117/52 (03/19 0500) ?SpO2:  [98 %-100 %] 100 % (03/19 0856) ?Last BM Date : 02/26/22 ?General:    Jaundiced white male in NAD ?Heart:  Regular rate and rhythm; no murmurs ?Lungs: Respirations even and unlabored, lungs CTA bilaterally ?Abdomen:  Soft, mild epigastric ttp and nondistended. Normal bowel sounds. ?Psych:  Cooperative. Normal mood and affect. ? ?Intake/Output from previous day: ?03/18 0701 - 03/19 0700 ?In: 600 [P.O.:600] ?Out: -  ?Intake/Output this shift: ?Total I/O ?In: 240 [P.O.:240] ?Out: -  ? ?Lab Results: ?Recent Labs  ?  02/26/22 ?0501 02/27/22 ?0530 02/28/22 ?5462  ?WBC 6.9 8.8 12.4*  ?HGB 9.4* 9.9* 8.9*  ?HCT 27.9* 28.1* 26.0*  ?PLT 218 210 211  ? ?BMET ?Recent Labs  ?  02/26/22 ?0501 02/27/22 ?0530 02/28/22 ?7035  ?NA 135 133* 134*  ?K 4.1 4.1 4.3  ?CL 108 105 107  ?CO2 20* 20* 19*  ?GLUCOSE 106* 148* 118*  ?BUN 26* 29* 36*  ?CREATININE 1.22 1.21 1.42*  ?CALCIUM 8.2* 8.2* 8.0*  ? ?LFT ?Recent Labs  ?  02/28/22 ?0093  ?PROT 4.8*  ?ALBUMIN 2.4*  ?AST 112*  ?ALT 158*  ?ALKPHOS 339*  ?BILITOT 8.5*  ? ?Lipase  ?   ?Component Value Date/Time  ? LIPASE 278 (H) 02/28/2022 0545  ? ? ? Assessment /  Plan:   ?Assessment: ?1.  Painless jaundice with elevated LFTs: LFTs bumped back up overnight, ERCP 02/26/2022, now with some epigastric pain and reflux overnight and a bump in lipase, concern for post ERCP pancreatitis versus relation to ongoing process in the bile duct ?2.  History of adenocarcinoma of the rectum ?3.  CKD ?4.  Mild anemia ? ?Plan: ?1.  Patient is scheduled for EUS/ERCP tomorrow with Dr. Rush Landmark. ?2.  Patient can continue regular diet today and n.p.o. at midnight ?3.  Patient received a 500 mL bolus this morning as per hospitalist team.  Concern for post ERCP pancreatitis. ?4.  Continue to monitor LFTs ?5.  Spent time answering the patient and his wife's questions about upcoming procedures tomorrow. ?6.  Still awaiting results from pathology, we did request rush results yesterday, though no results are in the chart. ? ?Thank you for your kind consultation, we will continue to follow. ?  ? LOS: 4 days  ? ?Lavone Nian Lemmon  02/28/2022, 10:26 AM ? ? Attending physician's note  ? ?I have taken a history, reviewed the chart and examined the patient. I performed a substantive portion of this encounter, including complete performance of at least one of the key components, in conjunction with the APP. I agree with the APP's note,  impression and recommendations.  ? ?Denies any abdominal pain or vomiting.  He complains of heartburn last night ?Lipase is elevated, received for IV bolus and continue IV fluids ? ?Plan for EUS and ERCP tomorrow ?N.p.o. after midnight ? ?Preliminary pathology report was called back to Dr. 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 ? ? The patient was provided an opportunity to ask questions and all were answered. The patient agreed with the plan and demonstrated an understanding of the instructions. ? ? ?K. Denzil Magnuson , MD ?[phone]    ?

## 2022-02-28 NOTE — H&P (View-Only) (Signed)
? ? Progress Note ? ? Subjective  ?Day #5 ? ?Chief Complaint: Painless jaundice and elevated LFTs ? ?Status post ERCP 02/26/2022 with inability to cannulate duct due to it being in a diverticulum, biopsies were taken but have not resulted. ? ?Today, the patient tells me he had a rough night with a lot of reflux symptoms and some epigastric discomfort.  He did manage to eat his breakfast, but is coughing at time of my interview.  Tells me the hospitalist already saw him and told him there and to give him some medicine.  Aware of plans for repeat procedures tomorrow.  Answered all his questions. ? ? Objective  ? ?Vital signs in last 24 hours: ?Temp:  [97.8 ?F (36.6 ?C)-98.3 ?F (36.8 ?C)] 98 ?F (36.7 ?C) (03/19 0500) ?Pulse Rate:  [74-77] 74 (03/19 0500) ?Resp:  [16-18] 18 (03/19 0500) ?BP: (103-117)/(52-58) 117/52 (03/19 0500) ?SpO2:  [98 %-100 %] 100 % (03/19 0856) ?Last BM Date : 02/26/22 ?General:    Jaundiced white male in NAD ?Heart:  Regular rate and rhythm; no murmurs ?Lungs: Respirations even and unlabored, lungs CTA bilaterally ?Abdomen:  Soft, mild epigastric ttp and nondistended. Normal bowel sounds. ?Psych:  Cooperative. Normal mood and affect. ? ?Intake/Output from previous day: ?03/18 0701 - 03/19 0700 ?In: 600 [P.O.:600] ?Out: -  ?Intake/Output this shift: ?Total I/O ?In: 240 [P.O.:240] ?Out: -  ? ?Lab Results: ?Recent Labs  ?  02/26/22 ?0501 02/27/22 ?0530 02/28/22 ?5364  ?WBC 6.9 8.8 12.4*  ?HGB 9.4* 9.9* 8.9*  ?HCT 27.9* 28.1* 26.0*  ?PLT 218 210 211  ? ?BMET ?Recent Labs  ?  02/26/22 ?0501 02/27/22 ?0530 02/28/22 ?6803  ?NA 135 133* 134*  ?K 4.1 4.1 4.3  ?CL 108 105 107  ?CO2 20* 20* 19*  ?GLUCOSE 106* 148* 118*  ?BUN 26* 29* 36*  ?CREATININE 1.22 1.21 1.42*  ?CALCIUM 8.2* 8.2* 8.0*  ? ?LFT ?Recent Labs  ?  02/28/22 ?2122  ?PROT 4.8*  ?ALBUMIN 2.4*  ?AST 112*  ?ALT 158*  ?ALKPHOS 339*  ?BILITOT 8.5*  ? ?Lipase  ?   ?Component Value Date/Time  ? LIPASE 278 (H) 02/28/2022 0545  ? ? ? Assessment /  Plan:   ?Assessment: ?1.  Painless jaundice with elevated LFTs: LFTs bumped back up overnight, ERCP 02/26/2022, now with some epigastric pain and reflux overnight and a bump in lipase, concern for post ERCP pancreatitis versus relation to ongoing process in the bile duct ?2.  History of adenocarcinoma of the rectum ?3.  CKD ?4.  Mild anemia ? ?Plan: ?1.  Patient is scheduled for EUS/ERCP tomorrow with Dr. Rush Landmark. ?2.  Patient can continue regular diet today and n.p.o. at midnight ?3.  Patient received a 500 mL bolus this morning as per hospitalist team.  Concern for post ERCP pancreatitis. ?4.  Continue to monitor LFTs ?5.  Spent time answering the patient and his wife's questions about upcoming procedures tomorrow. ?6.  Still awaiting results from pathology, we did request rush results yesterday, though no results are in the chart. ? ?Thank you for your kind consultation, we will continue to follow. ?  ? LOS: 4 days  ? ?Lavone Nian Lemmon  02/28/2022, 10:26 AM ? ? Attending physician's note  ? ?I have taken a history, reviewed the chart and examined the patient. I performed a substantive portion of this encounter, including complete performance of at least one of the key components, in conjunction with the APP. I agree with the APP's note,  impression and recommendations.  ? ?Denies any abdominal pain or vomiting.  He complains of heartburn last night ?Lipase is elevated, received for IV bolus and continue IV fluids ? ?Plan for EUS and ERCP tomorrow ?N.p.o. after midnight ? ?Preliminary pathology report was called back to Dr. 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 ? ? The patient was provided an opportunity to ask questions and all were answered. The patient agreed with the plan and demonstrated an understanding of the instructions. ? ? ?K. Denzil Magnuson , MD ?[phone]    ?

## 2022-03-01 ENCOUNTER — Inpatient Hospital Stay (HOSPITAL_COMMUNITY): Payer: Medicare Other | Admitting: Anesthesiology

## 2022-03-01 ENCOUNTER — Inpatient Hospital Stay (HOSPITAL_COMMUNITY): Payer: Medicare Other

## 2022-03-01 ENCOUNTER — Encounter (HOSPITAL_COMMUNITY)
Admission: EM | Disposition: A | Discharge status: Home Health | Payer: Self-pay | Source: Home / Self Care | Attending: Student

## 2022-03-01 ENCOUNTER — Encounter (HOSPITAL_COMMUNITY): Payer: Self-pay | Admitting: Internal Medicine

## 2022-03-01 DIAGNOSIS — K3189 Other diseases of stomach and duodenum: Secondary | ICD-10-CM

## 2022-03-01 DIAGNOSIS — K2970 Gastritis, unspecified, without bleeding: Secondary | ICD-10-CM

## 2022-03-01 DIAGNOSIS — K831 Obstruction of bile duct: Secondary | ICD-10-CM | POA: Diagnosis not present

## 2022-03-01 DIAGNOSIS — R509 Fever, unspecified: Secondary | ICD-10-CM

## 2022-03-01 DIAGNOSIS — K208 Other esophagitis without bleeding: Secondary | ICD-10-CM

## 2022-03-01 DIAGNOSIS — K8689 Other specified diseases of pancreas: Secondary | ICD-10-CM

## 2022-03-01 DIAGNOSIS — C249 Malignant neoplasm of biliary tract, unspecified: Secondary | ICD-10-CM

## 2022-03-01 DIAGNOSIS — R7989 Other specified abnormal findings of blood chemistry: Secondary | ICD-10-CM | POA: Diagnosis not present

## 2022-03-01 DIAGNOSIS — R748 Abnormal levels of other serum enzymes: Secondary | ICD-10-CM | POA: Diagnosis not present

## 2022-03-01 HISTORY — PX: SPHINCTEROTOMY: SHX5279

## 2022-03-01 HISTORY — PX: ERCP: SHX5425

## 2022-03-01 HISTORY — PX: EUS: SHX5427

## 2022-03-01 HISTORY — PX: BILIARY STENT PLACEMENT: SHX5538

## 2022-03-01 HISTORY — PX: BIOPSY: SHX5522

## 2022-03-01 HISTORY — PX: ESOPHAGOGASTRODUODENOSCOPY (EGD) WITH PROPOFOL: SHX5813

## 2022-03-01 HISTORY — PX: FINE NEEDLE ASPIRATION: SHX6590

## 2022-03-01 LAB — COMPREHENSIVE METABOLIC PANEL
ALT: 194 U/L — ABNORMAL HIGH (ref 0–44)
AST: 155 U/L — ABNORMAL HIGH (ref 15–41)
Albumin: 2.8 g/dL — ABNORMAL LOW (ref 3.5–5.0)
Alkaline Phosphatase: 369 U/L — ABNORMAL HIGH (ref 38–126)
Anion gap: 5 (ref 5–15)
BUN: 33 mg/dL — ABNORMAL HIGH (ref 8–23)
CO2: 19 mmol/L — ABNORMAL LOW (ref 22–32)
Calcium: 8 mg/dL — ABNORMAL LOW (ref 8.9–10.3)
Chloride: 110 mmol/L (ref 98–111)
Creatinine, Ser: 1.14 mg/dL (ref 0.61–1.24)
GFR, Estimated: >60 mL/min (ref >60)
Glucose, Bld: 102 mg/dL — ABNORMAL HIGH (ref 70–99)
Potassium: 4 mmol/L (ref 3.5–5.1)
Sodium: 134 mmol/L — ABNORMAL LOW (ref 135–145)
Total Bilirubin: 11.5 mg/dL — ABNORMAL HIGH (ref 0.3–1.2)
Total Protein: 5.6 g/dL — ABNORMAL LOW (ref 6.5–8.1)

## 2022-03-01 LAB — CBC WITH DIFFERENTIAL/PLATELET
Abs Immature Granulocytes: 0.13 10*3/uL — ABNORMAL HIGH (ref 0.00–0.07)
Basophils Absolute: 0 10*3/uL (ref 0.0–0.1)
Basophils Relative: 0 %
Eosinophils Absolute: 0.2 10*3/uL (ref 0.0–0.5)
Eosinophils Relative: 2 %
HCT: 30.2 % — ABNORMAL LOW (ref 39.0–52.0)
Hemoglobin: 10.1 g/dL — ABNORMAL LOW (ref 13.0–17.0)
Immature Granulocytes: 1 %
Lymphocytes Relative: 4 %
Lymphs Abs: 0.4 10*3/uL — ABNORMAL LOW (ref 0.7–4.0)
MCH: 35.3 pg — ABNORMAL HIGH (ref 26.0–34.0)
MCHC: 33.4 g/dL (ref 30.0–36.0)
MCV: 105.6 fL — ABNORMAL HIGH (ref 80.0–100.0)
Monocytes Absolute: 0.9 10*3/uL (ref 0.1–1.0)
Monocytes Relative: 9 %
Neutro Abs: 8.8 10*3/uL — ABNORMAL HIGH (ref 1.7–7.7)
Neutrophils Relative %: 84 %
Platelets: 211 10*3/uL (ref 150–400)
RBC: 2.86 MIL/uL — ABNORMAL LOW (ref 4.22–5.81)
RDW: 18.4 % — ABNORMAL HIGH (ref 11.5–15.5)
WBC: 10.5 10*3/uL (ref 4.0–10.5)
nRBC: 0 % (ref 0.0–0.2)

## 2022-03-01 LAB — MAGNESIUM: Magnesium: 2 mg/dL (ref 1.7–2.4)

## 2022-03-01 LAB — PHOSPHORUS: Phosphorus: 3.1 mg/dL (ref 2.5–4.6)

## 2022-03-01 LAB — AMMONIA: Ammonia: 27 umol/L (ref 9–35)

## 2022-03-01 LAB — SURGICAL PATHOLOGY

## 2022-03-01 LAB — LIPASE, BLOOD: Lipase: 86 U/L — ABNORMAL HIGH (ref 11–51)

## 2022-03-01 SURGERY — ESOPHAGEAL ENDOSCOPIC ULTRASOUND (EUS) RADIAL
Anesthesia: General

## 2022-03-01 MED ORDER — SODIUM CHLORIDE 0.9 % IV SOLN: INTRAVENOUS | Status: DC

## 2022-03-01 MED ORDER — ALBUTEROL SULFATE (2.5 MG/3ML) 0.083% IN NEBU
INHALATION_SOLUTION | RESPIRATORY_TRACT | Status: AC
  Administered 2022-03-01: 2.5 mg
  Filled 2022-03-01: qty 3

## 2022-03-01 MED ORDER — GLUCAGON HCL RDNA (DIAGNOSTIC) 1 MG IJ SOLR
INTRAMUSCULAR | Status: DC | PRN
  Administered 2022-03-01 (×4): .25 mg via INTRAVENOUS

## 2022-03-01 MED ORDER — FENTANYL CITRATE (PF) 100 MCG/2ML IJ SOLN
INTRAMUSCULAR | Status: DC | PRN
  Administered 2022-03-01: 50 ug via INTRAVENOUS
  Administered 2022-03-01 (×2): 25 ug via INTRAVENOUS

## 2022-03-01 MED ORDER — LEVALBUTEROL HCL 0.63 MG/3ML IN NEBU
0.6300 mg | INHALATION_SOLUTION | Freq: Two times a day (BID) | RESPIRATORY_TRACT | Status: DC
  Administered 2022-03-01 – 2022-03-02 (×3): 0.63 mg via RESPIRATORY_TRACT
  Filled 2022-03-01 (×3): qty 3

## 2022-03-01 MED ORDER — FUROSEMIDE 10 MG/ML IJ SOLN
20.0000 mg | Freq: Once | INTRAMUSCULAR | Status: DC
Start: 2022-03-01 — End: 2022-03-01

## 2022-03-01 MED ORDER — LIDOCAINE 2% (20 MG/ML) 5 ML SYRINGE
INTRAMUSCULAR | Status: DC | PRN
  Administered 2022-03-01: 60 mg via INTRAVENOUS

## 2022-03-01 MED ORDER — SODIUM CHLORIDE 0.9 % IV SOLN
3.0000 g | Freq: Four times a day (QID) | INTRAVENOUS | Status: DC
  Administered 2022-03-01 – 2022-03-03 (×7): 3 g via INTRAVENOUS
  Filled 2022-03-01 (×9): qty 8

## 2022-03-01 MED ORDER — ROCURONIUM BROMIDE 10 MG/ML (PF) SYRINGE
PREFILLED_SYRINGE | INTRAVENOUS | Status: DC | PRN
Start: 2022-03-01 — End: 2022-03-01
  Administered 2022-03-01: 20 mg via INTRAVENOUS
  Administered 2022-03-01: 50 mg via INTRAVENOUS

## 2022-03-01 MED ORDER — INDOMETHACIN 50 MG RE SUPP
RECTAL | Status: DC | PRN
  Administered 2022-03-01: 100 mg via RECTAL

## 2022-03-01 MED ORDER — PHENYLEPHRINE 40 MCG/ML (10ML) SYRINGE FOR IV PUSH (FOR BLOOD PRESSURE SUPPORT)
PREFILLED_SYRINGE | INTRAVENOUS | Status: DC | PRN
  Administered 2022-03-01 (×2): 80 ug via INTRAVENOUS

## 2022-03-01 MED ORDER — SUGAMMADEX SODIUM 200 MG/2ML IV SOLN
INTRAVENOUS | Status: DC | PRN
  Administered 2022-03-01: 200 mg via INTRAVENOUS

## 2022-03-01 MED ORDER — DEXAMETHASONE SODIUM PHOSPHATE 10 MG/ML IJ SOLN
INTRAMUSCULAR | Status: DC | PRN
  Administered 2022-03-01: 4 mg via INTRAVENOUS

## 2022-03-01 MED ORDER — CIPROFLOXACIN IN D5W 400 MG/200ML IV SOLN
INTRAVENOUS | Status: AC
  Filled 2022-03-01: qty 200

## 2022-03-01 MED ORDER — PANTOPRAZOLE SODIUM 40 MG PO TBEC
40.0000 mg | DELAYED_RELEASE_TABLET | Freq: Two times a day (BID) | ORAL | Status: DC
  Administered 2022-03-02 – 2022-03-03 (×3): 40 mg via ORAL
  Filled 2022-03-01 (×3): qty 1

## 2022-03-01 MED ORDER — SODIUM CHLORIDE 0.9 % IV BOLUS
500.0000 mL | Freq: Once | INTRAVENOUS | Status: AC
  Administered 2022-03-01: 500 mL via INTRAVENOUS

## 2022-03-01 MED ORDER — ALBUTEROL SULFATE (2.5 MG/3ML) 0.083% IN NEBU
2.5000 mg | INHALATION_SOLUTION | Freq: Four times a day (QID) | RESPIRATORY_TRACT | Status: DC | PRN
Start: 2022-03-01 — End: 2022-03-03

## 2022-03-01 MED ORDER — SODIUM CHLORIDE 0.9 % IV SOLN
1.5000 g | Freq: Once | INTRAVENOUS | Status: AC
  Administered 2022-03-01: 1.5 g via INTRAVENOUS
  Filled 2022-03-01: qty 1.5

## 2022-03-01 MED ORDER — LEVALBUTEROL HCL 0.63 MG/3ML IN NEBU
0.6300 mg | INHALATION_SOLUTION | Freq: Four times a day (QID) | RESPIRATORY_TRACT | Status: DC | PRN
  Administered 2022-03-01: 0.63 mg via RESPIRATORY_TRACT
  Filled 2022-03-01 (×2): qty 3

## 2022-03-01 MED ORDER — PROPOFOL 10 MG/ML IV BOLUS
INTRAVENOUS | Status: DC | PRN
  Administered 2022-03-01: 50 mg via INTRAVENOUS

## 2022-03-01 MED ORDER — INDOMETHACIN 50 MG RE SUPP
RECTAL | Status: AC
  Filled 2022-03-01: qty 2

## 2022-03-01 MED ORDER — LACTATED RINGERS IV SOLN: INTRAVENOUS | Status: DC

## 2022-03-01 MED ORDER — ALBUTEROL SULFATE HFA 108 (90 BASE) MCG/ACT IN AERS
INHALATION_SPRAY | RESPIRATORY_TRACT | Status: DC | PRN
  Administered 2022-03-01: 2 via RESPIRATORY_TRACT

## 2022-03-01 MED ORDER — ONDANSETRON HCL 4 MG/2ML IJ SOLN
INTRAMUSCULAR | Status: DC | PRN
  Administered 2022-03-01: 4 mg via INTRAVENOUS

## 2022-03-01 MED ORDER — PROPOFOL 10 MG/ML IV BOLUS
INTRAVENOUS | Status: AC
  Filled 2022-03-01: qty 20

## 2022-03-01 MED ORDER — FENTANYL CITRATE (PF) 100 MCG/2ML IJ SOLN
INTRAMUSCULAR | Status: AC
  Filled 2022-03-01: qty 2

## 2022-03-01 MED ORDER — SODIUM CHLORIDE 0.9 % IV SOLN
INTRAVENOUS | Status: DC | PRN
  Administered 2022-03-01: 25 mL

## 2022-03-01 MED ORDER — INDOMETHACIN 50 MG RE SUPP: 100.0000 mg | Freq: Once | RECTAL | Status: DC

## 2022-03-01 NOTE — Interval H&P Note (Signed)
History and Physical Interval Note: ? ?03/01/2022 ?3:17 PM ? ?Eric [name]  has presented today for surgery, with the diagnosis of Abnormal LFT's.  The various methods of treatment have been discussed with the patient and family. After consideration of risks, benefits and other options for treatment, the patient has consented to  Procedure(s): ?ESOPHAGEAL ENDOSCOPIC ULTRASOUND (EUS) RADIAL (N/A) ?ENDOSCOPIC RETROGRADE CHOLANGIOPANCREATOGRAPHY (ERCP) (N/A) as a surgical intervention.  The patient's history has been reviewed, patient examined, no change in status, stable for surgery.  I have reviewed the patient's chart and labs.  Questions were answered to the patient's satisfaction.   ? ?The risks of an EUS including intestinal perforation, bleeding, infection, aspiration, and medication effects were discussed as was the possibility it may not give a definitive diagnosis if a biopsy is performed.  When a biopsy of the pancreas is done as part of the EUS, there is an additional risk of pancreatitis at the rate of about 1-2%.  It was explained that procedure related pancreatitis is typically mild, although it can be severe and even life threatening, which is why we do not perform random pancreatic biopsies and only biopsy a lesion/area we feel is concerning enough to warrant the risk. ? ?The risks of an ERCP were discussed at length, including but not limited to the risk of perforation, bleeding, abdominal pain, post-ERCP pancreatitis (while usually mild can be severe and even life threatening). ? ? ?Irving Copas ? ? ?

## 2022-03-01 NOTE — Op Note (Signed)
[facility] ?Patient Name: Eric [name] ?Procedure Date: 03/01/2022 ?MRN: [id number] ?Attending MD: Justice Britain , MD ?Date of Birth: 03/11/1929 ?CSN: [id number] ?Age: 88 ?Admit Type: Inpatient ?Procedure:                ERCP ?Indications:              Common bile duct stricture, Abnormal MRCP,  ?                          Jaundice, Prior failed Endoscopic Retrograde  ?                          Cholangiopancreatography ?Providers:                Justice Britain, MD, Jeanella Cara, RN,  ?                          Designer, industrial/product, Merchant navy officer, [place]  ?                          [place], Technician ?Referring MD:             Jackquline Denmark, MD, Docia Chuck. Henrene Pastor, MD, Bretta Bang T. Gonfa ?Medicines:                General Anesthesia, Unasyn 3 g IV, Indomethacin 100  ?                          mg PR, Glucagon 1 mg IV ?Complications:            No immediate complications. ?Estimated Blood Loss:     Estimated blood loss was minimal. ?Procedure:                Pre-Anesthesia Assessment: ?                          - Prior to the procedure, a History and Physical  ?                          was performed, and patient medications and  ?                          allergies were reviewed. The patient's tolerance of  ?                          previous anesthesia was also reviewed. The risks  ?                          and benefits of the procedure and the sedation  ?                          options and risks were discussed with the patient.  ?                          All questions were answered, and informed consent  ?  was obtained. Prior Anticoagulants: The patient has  ?                          taken no previous anticoagulant or antiplatelet  ?                          agents. ASA Grade Assessment: III - A patient with  ?                          severe systemic disease. After reviewing the risks  ?                          and benefits, the patient was deemed in  ?                           satisfactory condition to undergo the procedure. ?                          After obtaining informed consent, the scope was  ?                          passed under direct vision. Throughout the  ?                          procedure, the patient's blood pressure, pulse, and  ?                          oxygen saturations were monitored continuously. The  ?                          [id number]-[id number] ([id number]) Olympus duodenoscope was  ?                          introduced through the mouth, and used to inject  ?                          contrast into and used to inject contrast into the  ?                          bile duct. The ERCP was accomplished without  ?                          difficulty. The patient tolerated the procedure. ?Scope In: ?Scope Out: ?Findings: ?     The scout film was normal. ?     The esophagus was successfully intubated under direct vision without  ?     detailed examination of the pharynx, larynx, and associated structures,  ?     and upper GI tract. The major papilla was located entirely within a  ?     diverticulum. The major papilla was edematous, erythematous, and  ?     frondlike/villous. ?     Typical short position was not felt to give me an adequate visualization  ?     of the ampulla. However, in the semi-long position, I got a better view  ?  of the very distal aspect of the ampulla. A short 0.025 inch Revolution  ?     Antonietta Breach was passed into the biliary tree on first attempt. The  ?     Revolution Jagtome sphincterotome was passed over the guidewire and the  ?     bile duct was then deeply cannulated. Contrast was injected. I  ?     personally interpreted the bile duct images. Ductal flow of contrast was  ?     adequate. Image quality was adequate. Contrast extended to the hepatic  ?     ducts. Opacification of the entire biliary tree except for the cystic  ?     duct and gallbladder was successful. The lower third of the main duct  ?     contained a single  severe stenosis 10 mm in length. The main bile duct  ?     was moderately dilated due to aforementioned stricture. The largest  ?     diameter was 11 mm. A 4 mm biliary sphincterotomy was made with a  ?     monofilament Jagtome sphincterotome using ERBE electrocautery - I could  ?     not get adequate visualization to do a fully appropriate sphincterotomy  ?     however. There was self limited oozing from the sphincterotomy which did  ?     not require treatment. Cells for cytology were obtained by brushing in  ?     the lower third of the main bile duct at the stricture with 2 separate  ?     brushes. The biliary tree was swept with a Revolution Jagtome  ?     sphincterotome starting at the bifurcation and overt sludge was swept  ?     from the duct. One 10 Fr by 5 cm plastic biliary stent with a single  ?     external flap and a single internal flap was placed into the common bile  ?     duct. Bile flowed through the stent. The stent was in good position. ?     A pancreatogram was not performed. ?     The duodenoscope was withdrawn from the patient. ?Impression:               - The major papilla was located entirely within a  ?                          diverticulum. ?                          - The major papilla appeared edematous,  ?                          erythematous and mass-like ?                          - A single severe biliary stricture was found in  ?                          the distal third of the main bile duct. The  ?                          stricture was malignant appearing. This was brushed  ?  for cytology x 2 brushes. ?                          - The entire main bile duct was moderately dilated  ?                          due to the stricture. ?                          - A small biliary sphincterotomy was performed -  ?                          could not get adequate visualization to do a larger  ?                          cut. ?                          - The biliary  tree was swept and sludge was found. ?                          - One plastic biliary stent was placed into the  ?                          common bile duct to traverse the stricture. ?Moderate Sedation: ?     Not Applicable - Patient had care per Anesthesia. ?Recommendation:           - The patient will be observed post-procedure,  ?                          until all discharge criteria are met. ?                          - Return patient to hospital ward for ongoing care. ?                          - Low fat diet. ?                          - Observe patient's clinical course. ?                          - Check liver enzymes (AST, ALT, alkaline  ?                          phosphatase, bilirubin) in the morning. ?                          - Check CA 19-9 in the morning. ?                          - Watch for pancreatitis, bleeding, perforation,  ?                          and cholangitis. ?                          -  Await cytology results and await tumor markers. ?                          - Repeat ERCP in 4-6 months to exchange stent to  ?                          Uncovered SEMS. I did not feel comfortable placing  ?                          a FCSEMS due to how dilated and filled the  ?                          gallbladder was in regards to sludge/debris - I  ?                          thought I would have increased risk of  ?                          cholecystitis. ?                          - Referral to Oncology as noted on EUS report  ?                          should be made due to the previously noted findings  ?                          at pathology. This is not an endoscopically  ?                          amenable lesion to resection. Palliation is the  ?                          only thing that our stents will be able to do. ?                          - Watch for pancreatitis, bleeding, perforation,  ?                          and cholangitis. ?                          - The findings and  recommendations were discussed  ?                          with the patient. ?                          - The findings and recommendations were discussed  ?                          with the patient's family. ?

## 2022-03-01 NOTE — Anesthesia Postprocedure Evaluation (Signed)
Anesthesia Post Note ? ?Patient: Eric [name] ? ?Procedure(s) Performed: ESOPHAGEAL ENDOSCOPIC ULTRASOUND (EUS) RADIAL ?ENDOSCOPIC RETROGRADE CHOLANGIOPANCREATOGRAPHY (ERCP) ?BIOPSY ?FINE NEEDLE ASPIRATION ?BILIARY STENT PLACEMENT ?SPHINCTEROTOMY ? ?  ? ?Patient location during evaluation: PACU ?Anesthesia Type: General ?Level of consciousness: awake and alert, oriented and patient cooperative ?Pain management: pain level controlled ?Vital Signs Assessment: post-procedure vital signs reviewed and stable ?Respiratory status: spontaneous breathing, nonlabored ventilation, respiratory function stable and patient connected to nasal cannula oxygen (wheezing has resolved) ?Cardiovascular status: blood pressure returned to baseline and stable ?Postop Assessment: no apparent nausea or vomiting ?Anesthetic complications: no ? ? ?No notable events documented. ? ?Last Vitals:  ?Vitals:  ? 03/01/22 1815 03/01/22 1830  ?BP: (!) 145/76 (!) 146/69  ?Pulse: (!) 106 95  ?Resp: (!) 23 (!) 25  ?Temp: 37.1 ?C   ?SpO2: 100% 98%  ?  ?Last Pain:  ?Vitals:  ? 03/01/22 1830  ?TempSrc:   ?PainSc: 0-No pain  ? ? ?  ?  ?  ?  ?  ?  ? ?Ameria Sanjurjo,E. Eulalia Ellerman ? ? ? ? ?

## 2022-03-01 NOTE — Care Management Important Message (Signed)
Important Message ? ?Patient Details IM Letter placed in Patients room. ?Name: Eric [name] ?MRN: [id number] ?Date of Birth: [birth year] ? ? ?Medicare Important Message Given:  Yes ? ? ? ? ?Kerin Salen ?03/01/2022, 10:02 AM ?

## 2022-03-01 NOTE — Progress Notes (Signed)
OT Cancellation Note ? ?Patient Details ?Name: Eric [name] ?MRN: [id number] ?DOB: 31-Oct-1929 ? ? ?Cancelled Treatment:    Reason Eval/Treat Not Completed: Patient at procedure or test/ unavailable ?OT to continue to follow and check back as schedule will allow.  ?Angelic Schnelle OTR/L, MS ?Acute Rehabilitation Department ?Office# [phone] ?Pager# [phone] ? ?[name] ?03/01/2022, 3:33 PM ?

## 2022-03-01 NOTE — Anesthesia Procedure Notes (Signed)
Procedure Name: Intubation ?Date/Time: 03/01/2022 3:55 PM ?Performed by: Renato Shin, CRNA ?Pre-anesthesia Checklist: Patient identified, Emergency Drugs available, Suction available and Patient being monitored ?Patient Re-evaluated:Patient Re-evaluated prior to induction ?Oxygen Delivery Method: Circle system utilized ?Preoxygenation: Pre-oxygenation with 100% oxygen ?Induction Type: IV induction ?Ventilation: Mask ventilation without difficulty and Oral airway inserted - appropriate to patient size ?Laryngoscope Size: Sabra Heck and 3 ?Grade View: Grade I ?Tube type: Oral ?Tube size: 7.5 mm ?Number of attempts: 1 ?Airway Equipment and Method: Stylet and Oral airway ?Placement Confirmation: ETT inserted through vocal cords under direct vision, positive ETCO2 and breath sounds checked- equal and bilateral ?Secured at: 21 cm ?Tube secured with: Tape ?Dental Injury: Teeth and Oropharynx as per pre-operative assessment  ? ? ? ? ?

## 2022-03-01 NOTE — Progress Notes (Signed)
Physical Therapy Treatment ?Patient Details ?Name: Eric [name] ?MRN: [id number] ?DOB: 04/29/1929 ?Today's Date: 03/01/2022 ? ? ?History of Present Illness Patient is a 88 year old male admitted with 4 days of dark urine and pruritis. Dx of obstructive jaundice.  MRCP on 3/15, possible ERCP 3/16. PMH: HTN, BPH, colorectal cancer ? ?  ?PT Comments  ? ? Pt is lethargic this morning, he reports he slept poorly last night. RN reports he had fever and hypoxia last night, now on 2L O2. Pt oriented x 4 but increased time to answer questions. Mod assist for supine to sit, Min assist to transfer to recliner. Pt soiled in urine and BM, he was able to stand for ~90 seconds to use urinal but then became dyspnic and needed to sit down. Decreased activity tolerance today.    ?Recommendations for follow up therapy are one component of a multi-disciplinary discharge planning process, led by the attending physician.  Recommendations may be updated based on patient status, additional functional criteria and insurance authorization. ? ?Follow Up Recommendations ? Home health PT ?  ?  ?Assistance Recommended at Discharge Intermittent Supervision/Assistance  ?Patient can return home with the following A little help with bathing/dressing/bathroom;Assistance with cooking/housework;Assist for transportation ?  ?Equipment Recommendations ? None recommended by PT  ?  ?Recommendations for Other Services   ? ? ?  ?Precautions / Restrictions Precautions ?Precautions: Fall ?Precaution Comments: diarrhea ?Restrictions ?Weight Bearing Restrictions: No  ?  ? ?Mobility ? Bed Mobility ?Overal bed mobility: Needs Assistance ?Bed Mobility: Supine to Sit ?  ?  ?Supine to sit: Mod assist ?  ?  ?General bed mobility comments: assist to raise trunk ?  ? ?Transfers ?Overall transfer level: Needs assistance ?Equipment used: Rolling walker (2 wheels) ?Transfers: Sit to/from Stand, Bed to chair/wheelchair/BSC ?Sit to Stand: Min assist ?  ?Step pivot  transfers: Min assist ?  ?  ?  ?General transfer comment: min A to power up, pt stood at edge of bed to use urinal and became SOB with O2 on, returned to sitting, then assisted with bed to recliner SPT with RW. Pt fatigues quickly. ?  ? ?Ambulation/Gait ?  ?  ?  ?  ?  ?  ?  ?General Gait Details: deferred 2* fatigue with SPT ? ? ?Stairs ?  ?  ?  ?  ?  ? ? ?Wheelchair Mobility ?  ? ?Modified Rankin (Stroke Patients Only) ?  ? ? ?  ?Balance Overall balance assessment: Needs assistance ?Sitting-balance support: Feet supported ?Sitting balance-Leahy Scale: Good ?  ?  ?Standing balance support: Reliant on assistive device for balance, During functional activity ?Standing balance-Leahy Scale: Poor ?  ?  ?  ?  ?  ?  ?  ?  ?  ?  ?  ?  ?  ? ?  ?Cognition Arousal/Alertness: Lethargic (pt stated he slept poorly last night) ?Behavior During Therapy: [facility] for tasks assessed/performed ?Overall Cognitive Status: Within Functional Limits for tasks assessed ?  ?  ?  ?  ?  ?  ?  ?  ?  ?  ?  ?  ?  ?  ?  ?  ?General Comments: oriented x 4 but increased time to answer orientation questions, lethargic today (pt reports this is 2* poor sleep last night) ?  ?  ? ?  ?Exercises   ? ?  ?General Comments   ?  ?  ? ?Pertinent Vitals/Pain Pain Assessment ?Faces Pain Scale: No hurt  ? ? ?  Home Living   ?  ?  ?  ?  ?  ?  ?  ?  ?  ?   ?  ?Prior Function    ?  ?  ?   ? ?PT Goals (current goals can now be found in the care plan section) Acute Rehab PT Goals ?Patient Stated Goal: return to independence with mobility ?PT Goal Formulation: With patient ?Time For Goal Achievement: 03/12/22 ?Potential to Achieve Goals: Good ?Progress towards PT goals: Not progressing toward goals - comment (decline in activity tolerance today) ? ?  ?Frequency ? ? ? Min 3X/week ? ? ? ?  ?PT Plan Current plan remains appropriate  ? ? ?Co-evaluation   ?  ?  ?  ?  ? ?  ?AM-PAC PT "6 Clicks" Mobility   ?Outcome Measure ? Help needed turning from your back to your side while in a  flat bed without using bedrails?: A Little ?Help needed moving from lying on your back to sitting on the side of a flat bed without using bedrails?: A Lot ?Help needed moving to and from a bed to a chair (including a wheelchair)?: A Little ?Help needed standing up from a chair using your arms (e.g., wheelchair or bedside chair)?: A Little ?Help needed to walk in hospital room?: A Little ?Help needed climbing 3-5 steps with a railing? : A Lot ?6 Click Score: 16 ? ?  ?End of Session Equipment Utilized During Treatment: Gait belt ?Activity Tolerance: Patient limited by fatigue ?Patient left: in chair;with call bell/phone within reach;with family/visitor present;with chair alarm set ?Nurse Communication: Mobility status;Other (comment) (bed and pt soiled in BM) ?PT Visit Diagnosis: Difficulty in walking, not elsewhere classified (R26.2) ?  ? ? ?Time: 2426-8341 ?PT Time Calculation (min) (ACUTE ONLY): 14 min ? ?Charges:  $Therapeutic Activity: 8-22 mins          ?          ? ?Philomena Doheny PT 03/01/2022  ?Acute Rehabilitation Services ?Pager [phone] ?Office [phone] ? ? ?

## 2022-03-01 NOTE — Progress Notes (Signed)
Patient was evaluated in the PACU/recovery. ?He is doing well. ?He did have some increased amounts of wheezing for which a breathing treatment was given by the PACU/anesthesia staff. ?I went over the results of the EUS/ERCP. ?I am ordering a chest x-ray and the patient can be sent upstairs.  The medicine service can follow this chest x-ray up this evening. ?The patient's family was also updated with the results of the EUS/ERCP. ? ?Justice Britain, MD ?[facility] ?Advanced Endoscopy ?Office # [phone] ?

## 2022-03-01 NOTE — Progress Notes (Signed)
?PROGRESS NOTE ? ?Eric [name] Didion OAC:[id number] DOB: November 10, 1929  ? ?PCP: Irish Lack., PA-C ? ?Patient is from: Home.  Lives with his wife.  Uses walker at baseline. ? ?DOA: 02/23/2022 LOS: 5 ? ?Chief complaints ?Chief Complaint  ?Patient presents with  ? Pruritis  ?  ? ?Brief Narrative / Interim history: ?88 year old M with PMH of colon rectal cancer, CKD-3B, HTN and BPH presenting with dark urine and pruritus for 4 days and about 10 pound unintentional weight loss in the month, and admitted for obstructive jaundice with elevated liver enzymes and hyperbilirubinemia.  CT A/P with several small scattered calcified liver granuloma, mild biliary ductal dilation but no calcified gallstone or pericholecystic fluid.  RUQ [place] with cholelithiasis without acute cholecystitis.  GI consulted.  MRCP with cholelithiasis, mild increase in caliber of CBD and main pancreatic duct up to the level of ampulla but no choledocholithiasis or obstructing mass.  ? ?Patient underwent ERCP on 3/17 but not able to cannulate duct.  Biopsies were taken.  Plan for repeat EUS/ERCP on 3/20.  ?Patient did spike fever the morning of 3/20.  Mild leukocytosis.  Unclear source of infection but concern about intra-abdominal.  Urine culture with insignificant growth.  Blood culture and chest x-ray pending.  Started IV Unasyn  ? ?Subjective: ?Seen and examined earlier this morning.  Spiked fever to 101 about 2 AM earlier this morning.  He says he feels like he was hit by a train.  Could not be specific.  He denies shortness of breath, cough, chest pain, nausea, vomiting, abdominal pain or UTI symptoms. ? ?Objective: ?Vitals:  ? 03/01/22 0200 03/01/22 0219 03/01/22 0227 03/01/22 0454  ?BP:    (!) 133/38  ?Pulse:    (!) 110  ?Resp:    14  ?Temp: (!) 101 ?F (38.3 ?C)   99.9 ?F (37.7 ?C)  ?TempSrc: Oral   Oral  ?SpO2:  (!) 89% 95% 99%  ?Weight:      ?Height:      ? ? ?Examination: ? ?GENERAL: No apparent distress.  Nontoxic. ?HEENT: MMM.  Sclera  icteric. ?NECK: Supple.  No apparent JVD.  ?RESP:  No IWOB.  Fair aeration bilaterally. ?CVS:  RRR. Heart sounds normal.  ?ABD/GI/GU: BS+. Abd soft, NTND.  ?MSK/EXT:  Moves extremities. No apparent deformity. No edema.  ?SKIN: Skin jaundice. ?NEURO: Awake and alert. Oriented appropriately.  No apparent focal neuro deficit. ?PSYCH: Calm. Normal affect.  ? ?Procedures:  ?3/17-ERCP with biopsy of ampulla Vater.  Pathology concerning for focal invasive adenocarcinoma ? ?Microbiology summarized: ?3/14-COVID-19 and influenza PCR nonreactive. ?3/14-urine culture with insignificant growth. ?3/20-blood culture pending. ? 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 ERCP on 3/17 but not able to cannulate duct.  Biopsies were taken and pathology concerning for focally invasive adenocarcinoma ?Recent Labs  ?Lab 02/25/22 ?0109 02/26/22 ?0501 02/27/22 ?0530 02/28/22 ?3235 03/01/22 ?0248  ?AST 161* 185* 129* 112* 155*  ?ALT 188* 185* 178* 158* 194*  ?ALKPHOS 386* 371* 416* 339* 369*  ?BILITOT 10.2* 10.1* 7.8* 8.5* 11.5*  ?PROT 5.1* 4.9* 5.1* 4.8* 5.6*  ?ALBUMIN 2.6* 2.5* 2.5* 2.4* 2.8*  ?-Plan for repeat EUS/ERCP on 3/20.  ?-Continue cholestyramine for pruritus. ? ?Hyperbilirubinemia ?See obstructive jaundice ?-Continue cholestyramine ? ?Fever ?Unclear source of infection but suspect intra-abdominal.  He was started on  oxygen but denies respiratory symptoms.  He had mild leukocytosis on 3/19 that seems to have improved.  Urine culture with insignificant growth.  From adenocarcinoma? ?-Start IV Unasyn ?-Follow-up blood culture and chest x-ray. ? ?Elevated LFTs ?See obstructive jaundice ? ?Macrocytic anemia ?Recent Labs  ?  02/23/22 ?2111 02/24/22 ?1021 02/25/22 ?9485 02/26/22 ?0501 02/27/22 ?0530 02/28/22 ?4627  03/01/22 ?0248  ?HGB 11.7* 11.0* 10.5* 9.4* 9.9* 8.9* 10.1*  ?H&H stable after initial drop likely dilutional.  Anemia panel consistent with anemia of chronic disease.  Denies melena or hematochezia. ?-Monitor ? ?Bacteriuria with pyuria ?Patient denies UTI symptoms.  Urine culture with insignificant growth.  Received ceftriaxone for 2 days.  UTI ruled out.  Now with fever ?-Started IV Unasyn. ? ?Elevated lipase ?Trended up after ERCP but improved. ?-Recheck in the morning ? ?GERD (gastroesophageal reflux disease) ?Protonix and GI cocktail ? ?Leukocytosis ?No clear source of infection but febrile.  Leukocytosis improved ?-Antibiotics as above ? ?Malnutrition of moderate degree ?Nutrition Status: ?Nutrition Problem: Moderate Malnutrition ?Etiology: acute illness (obstructive jaundice) ?Signs/Symptoms: energy intake < 75% for > 7 days, moderate fat depletion, moderate muscle depletion ?Interventions: Boost Breeze, MVI ? ?Class II obesity ?Body mass index is 35.29 kg/m?.  ? ?Metabolic acidosis ?Likely due to renal failure and IV fluid. ?Continue sodium bicarbonate ? ?History of colorectal cancer ?Outpatient follow-up. ? ?Hyponatremia ?Recent Labs  ?Lab 02/23/22 ?2111 02/24/22 ?1021 02/25/22 ?0350 02/26/22 ?0501 02/27/22 ?0530 02/28/22 ?0938 03/01/22 ?0248  ?NA 131* 132* 134* 135 133* 134* 134*  ?-Improving ?-IV fluid as above. ? ? ?HTN (hypertension) ?Normotensive but soft. ?-Continue holding home lisinopril and torsemide ? ?Benign prostatic hyperplasia ?Not on medication. ?-Monitor urine output ? 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  02/23/22 ?2111 02/24/22 ?1021 02/25/22 ?1829 02/26/22 ?0501 02/27/22 ?0530 02/28/22 ?9371 03/01/22 ?0248  ?BUN '22 21 22 '$ 26* 29* 36* 33*  ?CREATININE 1.45* 1.36* 1.29* 1.22 1.21 1.42* 1.14  ?Creatinine improved. ?-Continue NS at 100 cc an hour ?-Avoid nephrotoxic meds ?-Recheck in the morning ? ? ? ?  ?DVT prophylaxis:  ?SCDs Start:  02/24/22 1021 ? ?Code Status: Full code ?Family Communication: Updated patient's wife and daughter at bedside ?Level of care: Telemetry ?Status is: Inpatient ?Remains inpatient appropriate because: Obstructive jaundice with elevated liver enzymes and hyperbilirubinemia requiring further evaluation/EUS/repeat ERCP and fever with unknown source requiring IV antibiotics ? ? ?Final disposition: Home with home health once medically cleared ? ?Consultants:  ?Gastroenterology ? ?Sch Meds:  ?Scheduled Meds: ? cholestyramine light  4 g Oral BID  ? feeding supplement  1 Container Oral TID BM  ? levalbuterol  0.63 mg Nebulization BID  ? multivitamin with minerals  1 tablet Oral Daily  ? pantoprazole  40 mg Oral Daily  ? sodium bicarbonate  650 mg Oral TID  ? vitamin B-12  100 mcg Oral Daily  ? ?Continuous Infusions: ? sodium chloride 100 mL/hr at 03/01/22 0334  ? ampicillin-sulbactam (UNASYN) IV    ? ?PRN Meds:.acetaminophen **OR** acetaminophen, alum & mag hydroxide-simeth **AND** lidocaine, diphenhydrAMINE, levalbuterol, lip balm, ondansetron **OR** ondansetron (ZOFRAN) IV, oxyCODONE ? ?Antimicrobials: ?Anti-infectives (From admission, onward)  ? ? Start     Dose/Rate Route Frequency Ordered Stop  ? 03/01/22 1200  Ampicillin-Sulbactam (UNASYN) 3 g in sodium chloride 0.9 % 100 mL IVPB       ? 3 g ?200 mL/hr over 30 Minutes Intravenous Every 6 hours 03/01/22 1032    ? 02/24/22 2200  cefTRIAXone (ROCEPHIN) 2 g in sodium chloride 0.9 % 100 mL IVPB  Status:  Discontinued       ? 2 g ?200 mL/hr over 30 Minutes Intravenous Every 24 hours 02/24/22 1025 02/26/22 1007  ? 02/24/22 0030  cefTRIAXone (ROCEPHIN) 1 g in sodium chloride 0.9 % 100 mL IVPB       ? 1 g ?200 mL/hr over 30 Minutes Intravenous  Once 02/24/22 0017 02/24/22 0100  ? ?  ? ? ? ?I have personally reviewed the following labs and images: ?CBC: ?Recent Labs  ?Lab 02/23/22 ?2111 02/24/22 ?1021 02/25/22 ?6237 02/26/22 ?0501 02/27/22 ?0530 02/28/22 ?6283 03/01/22 ?0248  ?WBC  5.6 6.6 6.2 6.9 8.8 12.4* 10.5  ?NEUTROABS 4.1 5.0  --   --   --   --  8.8*  ?HGB 11.7* 11.0* 10.5* 9.4* 9.9* 8.9* 10.1*  ?HCT 33.4* 32.1* 30.1* 27.9* 28.1* 26.0* 30.2*  ?MCV 100.6* 103.2* 101.7* 102.6* 101.1* 102.8

## 2022-03-01 NOTE — Assessment & Plan Note (Addendum)
Unclear source of infection but suspect intra-abdominal.  Infectious work-up unrevealing.  Leukocytosis improved.  No further fever. ?-Started IV Unasyn on 3/20.  Will complete 5 days empirically ?

## 2022-03-01 NOTE — Op Note (Signed)
[facility] ?Patient Name: Eric [name] ?Procedure Date: 03/01/2022 ?MRN: [id number] ?Attending MD: Justice Britain , MD ?Date of Birth: 01/06/1929 ?CSN: [id number] ?Age: 88 ?Admit Type: Inpatient ?Procedure:                Upper EUS ?Indications:              Mucosal mass/polyp involving the major papilla  ?                          (found on endoscopy), Common bile duct dilation  ?                          (acquired) seen on MRCP, Pancreatic duct stricture  ?                          on MRCP, Suspected mass at the ampulla/papilla on  ?                          MRCP, Elevated liver enzymes ?Providers:                Justice Britain, MD, Jeanella Cara, RN,  ?                          Designer, industrial/product, Merchant [employer] officer, [place]  ?                          [place], Technician ?Referring MD:             Docia Chuck. Henrene Pastor, MD, Bretta Bang T. Fredia Beets, MD ?Medicines:                General Anesthesia ?Complications:            No immediate complications. ?Estimated Blood Loss:     Estimated blood loss was minimal. ?Procedure:                Pre-Anesthesia Assessment: ?                          - Prior to the procedure, a History and Physical  ?                          was performed, and patient medications and  ?                          allergies were reviewed. The patient's tolerance of  ?                          previous anesthesia was also reviewed. The risks  ?                          and benefits of the procedure and the sedation  ?                          options and risks were discussed with the patient.  ?  All questions were answered, and informed consent  ?                          was obtained. Prior Anticoagulants: The patient has  ?                          taken no previous anticoagulant or antiplatelet  ?                          agents. ASA Grade Assessment: III - A patient with  ?                          severe systemic disease. After  reviewing the risks  ?                          and benefits, the patient was deemed in  ?                          satisfactory condition to undergo the procedure. ?                          After obtaining informed consent, the endoscope was  ?                          passed under direct vision. Throughout the  ?                          procedure, the patient's blood pressure, pulse, and  ?                          oxygen saturations were monitored continuously. The  ?                          [id number]-[id number] ([id number]) Olympus endoscope was introduced  ?                          through the mouth, and advanced to the second part  ?                          of duodenum. The [id number]-[id number] ([id number]) Olympus  ?                          duodenoscope was introduced through the mouth, and  ?                          advanced to the area of papilla. The [id number]  ?                          ([id number]) Olympus linear ultrasound scope was  ?                          introduced through the mouth, and advanced to the  ?  duodenum for ultrasound examination from the  ?                          stomach and duodenum. The upper EUS was  ?                          accomplished without difficulty. The patient  ?                          tolerated the procedure. ?Scope In: ?Scope Out: ?Findings: ?     ENDOSCOPIC FINDING: : ?     No gross lesions were noted in the proximal esophagus. ?     LA Grade D (one or more mucosal breaks involving at least 75% of  ?     esophageal circumference) esophagitis with no bleeding was found in the  ?     middle and distal esophagus. Biopsies were taken with a cold forceps for  ?     histology. ?     The Z-line was irregular and was found 37 cm from the incisors. ?     Patchy mild inflammation characterized by erosions, erythema and  ?     granularity was found in the entire examined stomach. Biopsies were  ?     taken with a cold forceps for histology and Helicobacter pylori testing. ?      No gross lesions were noted in the duodenal bulb, in the first portion  ?     of the duodenum and in the second portion of the duodenum. ?     Mucosal abnormalities consistent with adenomatous change and congestion  ?     were noted at the medium sized papilla (found completely within a  ?     diverticulum). Biopsies were taken with a cold forceps for histology. ?     ENDOSONOGRAPHIC FINDING: : ?     A rounded intramural (subepithelial) lesion was found at the ampulla.  ?     The lesion was heterogenous. It measured 15 mm x 12 mm.  ?     Endosonographically, the lesion appeared to originate from within the  ?     submucosa (Layer 3) and muscularis propria (Layer 4). The outer margins  ?     were irregular. Fine needle biopsy was performed. Color Doppler imaging  ?     was utilized prior to needle puncture to confirm a lack of significant  ?     vascular structures within the needle path. Six passes were made with  ?     the Acquire 22 gauge ultrasound core biopsy needle using a transduodenal  ?     approach. A visible core of tissue was obtained. Final cytology results  ?     are pending. ?     The pancreatic duct had a dilated endosonographic appearance in the  ?     pancreatic head (4.5 mm), genu of the pancreas (3.7 mm), body of the  ?     pancreas (5.3 mm), tail of the pancreas (4.3 mm) and side branches of  ?     the pancreatic duct. ?     Pancreatic parenchymal abnormalities were noted in the entire pancreas.  ?     These consisted of atrophy. ?     There was dilation in the common bile duct (8.9 mm). ?  Moderate hyperechoic material consistent with sludge was visualized  ?     endosonographically in the common bile duct and in the gallbladder. ?     No malignant-appearing lymph nodes were visualized in the celiac region  ?     (level 20), peripancreatic region and porta hepatis region. ?     Endosonographic imaging in the visualized portion of the liver showed no  ?     mass. ?     The celiac region was  visualized. ?Impression:               EGD Impression: ?                          - No gross lesions in esophagus proximally. Severe  ?                          LA Grade D erosive esophagitis with no bleeding  ?                          found in the middle and distal esophagus - biopsied. ?                          - Z-line irregular, 37 cm from the incisors. ?                          - Gastritis. Biopsied. ?                          - No gross lesions in the duodenal bulb, in the  ?                          first portion of the duodenum and in the second  ?                          portion of the duodenum. ?                          - Mucosal abnormality noted at the ampullary region  ?                          - biopsied. ?                          EUS Impression: ?                          - An intramural (subepithelial) lesion was found at  ?                          the ampulla leading to upstream dilation of the  ?                          biliary and pancreatic ducts. The lesion appeared  ?                          to originate from within  the submucosa (Layer 3)  ?                          and muscularis propria (Layer 4). Fine needle  ?                          biopsy performed. ?                          - The pancreatic duct had a dilated endosonographic  ?                          appearance in the pancreatic head, genu of the  ?                          pancreas, body of the pancreas, tail of the  ?                          pancreas and side branches of the pancreatic duct. ?                          - Pancreatic parenchymal abnormalities consisting  ?                          of atrophy were noted in the entire pancreas. ?                          - There was dilation in the common bile duct. ?                          - Hyperechoic material consistent with sludge was  ?                          visualized endosonographically in the common bile  ?                          duct and in the  gallbladder. ?                          - No malignant-appearing lymph nodes were  ?                          visualized in the celiac region (level 20),  ?                          peripancreatic region and porta he

## 2022-03-01 NOTE — Anesthesia Preprocedure Evaluation (Addendum)
Anesthesia Evaluation  ?Patient identified by MRN, date of birth, ID band ?Patient awake ? ? ? ?Reviewed: ?Allergy & Precautions, H&P , NPO status , Patient's Chart, lab work & pertinent test results ? ?Airway ?Mallampati: II ? ?TM Distance: >3 FB ?Neck ROM: Full ? ? ? Dental ?no notable dental hx. ?(+) Edentulous Upper, Dental Advisory Given ?  ?Pulmonary ?neg pulmonary ROS, former smoker,  ?  ?Pulmonary exam normal ?breath sounds clear to auscultation ? ? ? ? ? ? Cardiovascular ?hypertension, Pt. on medications ? ?Rhythm:Regular Rate:Normal ? ?'21 ECHO: Left ventricular systolic function is grossly normal.  ?LV ejection fraction = 55-60%.  ?There is mild mitral regurgitation.  ?There is mild tricuspid regurgitation.  ?Mild pulmonary hypertension.  ? ?  ?Neuro/Psych ?negative neurological ROS ? negative psych ROS  ? GI/Hepatic ?Neg liver ROS, GERD  ,Elevated LFTs: adenoCa causing CBD obstruction ?H/o GI bleed ?  ?Endo/Other  ?negative endocrine ROS ? Renal/GU ?Renal InsufficiencyRenal diseaseH/o stones  ? ?BPH ?negative genitourinary ?  ?Musculoskeletal ? ?(+) Arthritis ,  ? Abdominal ?  ?Peds ? Hematology ? ?(+) Blood dyscrasia (Hb 10.1), anemia ,   ?Anesthesia Other Findings ? ? Reproductive/Obstetrics ?negative OB ROS ? ?  ? ? ? ? ? ? ? ? ? ? ? ? ? ?  ?  ? ? ? ? ? ? ? ?Anesthesia Physical ?Anesthesia Plan ? ?ASA: 3 ? ?Anesthesia Plan: General  ? ?Post-op Pain Management: Minimal or no pain anticipated  ? ?Induction: Intravenous ? ?PONV Risk Score and Plan: 2 and Ondansetron and Dexamethasone ? ?Airway Management Planned: Oral ETT ? ?Additional Equipment: None ? ?Intra-op Plan:  ? ?Post-operative Plan: Extubation in OR ? ?Informed Consent: I have reviewed the patients History and Physical, chart, labs and discussed the procedure including the risks, benefits and alternatives for the proposed anesthesia with the patient or authorized representative who has indicated his/her  understanding and acceptance.  ? ? ? ?Dental advisory given ? ?Plan Discussed with: CRNA ? ?Anesthesia Plan Comments:   ? ? ? ? ? ?Anesthesia Quick Evaluation ? ?

## 2022-03-01 NOTE — Transfer of Care (Signed)
Immediate Anesthesia Transfer of Care Note ? ?Patient: Eric [name] ? ?Procedure(s) Performed: ESOPHAGEAL ENDOSCOPIC ULTRASOUND (EUS) RADIAL ?ENDOSCOPIC RETROGRADE CHOLANGIOPANCREATOGRAPHY (ERCP) ?BIOPSY ?FINE NEEDLE ASPIRATION ?BILIARY STENT PLACEMENT ?SPHINCTEROTOMY ? ?Patient Location: PACU ? ?Anesthesia Type:General ? ?Level of Consciousness: awake and patient cooperative ? ?Airway & Oxygen Therapy: Patient Spontanous Breathing and Patient connected to face mask oxygen ? ?Post-op Assessment: Report given to RN, Post -op Vital signs reviewed and stable and pt with exp wheeze. Dr Glennon Mac at bedside. Neb given in PACU ? ?Post vital signs: Reviewed and stable ? ?Last Vitals:  ?Vitals Value Taken Time  ?BP 149/75 03/01/22 1800  ?Temp 36.6 ?C 03/01/22 1750  ?Pulse 97 03/01/22 1811  ?Resp 25 03/01/22 1811  ?SpO2 100 % 03/01/22 1811  ?Vitals shown include unvalidated device data. ? ?Last Pain:  ?Vitals:  ? 03/01/22 1800  ?TempSrc:   ?PainSc: 0-No pain  ?   ? ?  ? ?Complications: No notable events documented. ?

## 2022-03-02 ENCOUNTER — Encounter (HOSPITAL_COMMUNITY): Payer: Self-pay | Admitting: Gastroenterology

## 2022-03-02 DIAGNOSIS — K2970 Gastritis, unspecified, without bleeding: Secondary | ICD-10-CM

## 2022-03-02 DIAGNOSIS — K2950 Unspecified chronic gastritis without bleeding: Secondary | ICD-10-CM

## 2022-03-02 DIAGNOSIS — R748 Abnormal levels of other serum enzymes: Secondary | ICD-10-CM | POA: Diagnosis not present

## 2022-03-02 DIAGNOSIS — K209 Esophagitis, unspecified without bleeding: Secondary | ICD-10-CM

## 2022-03-02 DIAGNOSIS — N179 Acute kidney failure, unspecified: Secondary | ICD-10-CM

## 2022-03-02 DIAGNOSIS — R7989 Other specified abnormal findings of blood chemistry: Secondary | ICD-10-CM | POA: Diagnosis not present

## 2022-03-02 DIAGNOSIS — K831 Obstruction of bile duct: Secondary | ICD-10-CM | POA: Diagnosis not present

## 2022-03-02 LAB — COMPREHENSIVE METABOLIC PANEL
ALT: 142 U/L — ABNORMAL HIGH (ref 0–44)
AST: 95 U/L — ABNORMAL HIGH (ref 15–41)
Albumin: 2.3 g/dL — ABNORMAL LOW (ref 3.5–5.0)
Alkaline Phosphatase: 270 U/L — ABNORMAL HIGH (ref 38–126)
Anion gap: 7 (ref 5–15)
BUN: 38 mg/dL — ABNORMAL HIGH (ref 8–23)
CO2: 17 mmol/L — ABNORMAL LOW (ref 22–32)
Calcium: 7.7 mg/dL — ABNORMAL LOW (ref 8.9–10.3)
Chloride: 111 mmol/L (ref 98–111)
Creatinine, Ser: 1.57 mg/dL — ABNORMAL HIGH (ref 0.61–1.24)
GFR, Estimated: 42 mL/min — ABNORMAL LOW (ref >60)
Glucose, Bld: 178 mg/dL — ABNORMAL HIGH (ref 70–99)
Potassium: 4 mmol/L (ref 3.5–5.1)
Sodium: 135 mmol/L (ref 135–145)
Total Bilirubin: 7.9 mg/dL — ABNORMAL HIGH (ref 0.3–1.2)
Total Protein: 4.7 g/dL — ABNORMAL LOW (ref 6.5–8.1)

## 2022-03-02 LAB — CBC WITH DIFFERENTIAL/PLATELET
Abs Immature Granulocytes: 0.1 10*3/uL — ABNORMAL HIGH (ref 0.00–0.07)
Basophils Absolute: 0 10*3/uL (ref 0.0–0.1)
Basophils Relative: 0 %
Eosinophils Absolute: 0 10*3/uL (ref 0.0–0.5)
Eosinophils Relative: 0 %
HCT: 27.2 % — ABNORMAL LOW (ref 39.0–52.0)
Hemoglobin: 8.8 g/dL — ABNORMAL LOW (ref 13.0–17.0)
Immature Granulocytes: 1 %
Lymphocytes Relative: 2 %
Lymphs Abs: 0.2 10*3/uL — ABNORMAL LOW (ref 0.7–4.0)
MCH: 35.1 pg — ABNORMAL HIGH (ref 26.0–34.0)
MCHC: 32.4 g/dL (ref 30.0–36.0)
MCV: 108.4 fL — ABNORMAL HIGH (ref 80.0–100.0)
Monocytes Absolute: 0.4 10*3/uL (ref 0.1–1.0)
Monocytes Relative: 5 %
Neutro Abs: 7.5 10*3/uL (ref 1.7–7.7)
Neutrophils Relative %: 92 %
Platelets: 183 10*3/uL (ref 150–400)
RBC: 2.51 MIL/uL — ABNORMAL LOW (ref 4.22–5.81)
RDW: 18.1 % — ABNORMAL HIGH (ref 11.5–15.5)
WBC: 8.2 10*3/uL (ref 4.0–10.5)
nRBC: 0 % (ref 0.0–0.2)

## 2022-03-02 LAB — CANCER ANTIGEN 19-9: CA 19-9: 255 U/mL — ABNORMAL HIGH (ref 0–35)

## 2022-03-02 LAB — PHOSPHORUS: Phosphorus: 4.5 mg/dL (ref 2.5–4.6)

## 2022-03-02 LAB — LIPASE, BLOOD: Lipase: 52 U/L — ABNORMAL HIGH (ref 11–51)

## 2022-03-02 LAB — MAGNESIUM: Magnesium: 2 mg/dL (ref 1.7–2.4)

## 2022-03-02 MED ORDER — LACTATED RINGERS IV BOLUS
500.0000 mL | Freq: Once | INTRAVENOUS | Status: AC
  Administered 2022-03-02: 500 mL via INTRAVENOUS

## 2022-03-02 NOTE — Progress Notes (Signed)
?PROGRESS NOTE ? ?Eric [name] ZHY:[id number] DOB: March 15, 1929  ? ?PCP: Irish Lack., PA-C ? ?Patient is from: Home.  Lives with his wife.  Uses walker at baseline. ? ?DOA: 02/23/2022 LOS: 6 ? ?Chief complaints ?Chief Complaint  ?Patient presents with  ? Pruritis  ?  ? ?Brief Narrative / Interim history: ?88 year old M with PMH of colon rectal cancer, CKD-3B, HTN and BPH presenting with dark urine and pruritus for 4 days and about 10 pound unintentional weight loss in the month, and admitted for obstructive jaundice with elevated liver enzymes and hyperbilirubinemia.  CT A/P with several small scattered calcified liver granuloma, mild biliary ductal dilation but no calcified gallstone or pericholecystic fluid.  RUQ [place] with cholelithiasis without acute cholecystitis.  GI consulted.  MRCP with cholelithiasis, mild increase in caliber of CBD and main pancreatic duct up to the level of ampulla but no choledocholithiasis or obstructing mass.  ? ?Patient underwent ERCP on 3/17 but not able to cannulate duct.  Biopsies were taken.  Pathology with high-grade dysplasia concerning for focal invasive adenocarcinoma.  Subsequently had EUS with ERCP with Dr. Rush Landmark on 3/20, and found to have LA grade D esophagitis, gastritis, 15 mm ampullary lesion on EUS status post FNA, dilated pancreatic ducts, 10 mm stricture of the CBD which was brushed and stented. CA 19-9 is elevated.  GI recommended follow-up with oncology.  Patient prefers to follow-up with his oncologist in [facility].  GI recommended repeat ERCP in 4 to 6 months for stent exchange, and signed off. ? ?Patient did spike fever the morning of 3/20.  Had mild leukocytosis.  Unclear source of infection but concern about intra-abdominal.  Started IV Unasyn.  Urine culture with insignificant growth.  Blood cultures NGTD.  Chest x-ray without acute finding.  ? ?Could be discharged on 3/22 if creatinine improves.  ? ?Subjective: ?Seen and examined earlier this morning.   No major events overnight of this morning.  No complaints.  He denies chest pain, dyspnea, cough, GI or UTI symptoms.  Daughter and wife at bedside. ? ?Objective: ?Vitals:  ? 03/01/22 1845 03/01/22 2052 03/02/22 0446 03/02/22 0905  ?BP: (!) 126/58 (!) 104/53 109/65   ?Pulse: 75 80 67   ?Resp: (!) '21 14 14   '$ ?Temp:  98.5 ?F (36.9 ?C) 97.7 ?F (36.5 ?C)   ?TempSrc:  Oral Oral   ?SpO2: 97% 98% 100% 94%  ?Weight:      ?Height:      ? ? ?Examination: ? ?GENERAL: No apparent distress.  Nontoxic. ?HEENT: MMM.  Sclerae icteric. ?NECK: Supple.  No apparent JVD.  ?RESP:  No IWOB.  Fair aeration bilaterally. ?CVS:  RRR. Heart sounds normal.  ?ABD/GI/GU: BS+. Abd soft, NTND.  ?MSK/EXT:  Moves extremities. No apparent deformity.  Trace BLE edema. ?SKIN: Skin jaundice. ?NEURO: Awake and alert. Oriented appropriately.  No apparent focal neuro deficit. ?PSYCH: Calm. Normal affect.  ? ?Procedures:  ?3/17-ERCP with biopsy of ampulla Vater.  Pathology concerning for focal invasive adenocarcinoma ?3/20-EUS and ERCP. ? ?Microbiology summarized: ?3/14-COVID-19 and influenza PCR nonreactive. ?3/14-urine culture with insignificant growth. ?3/20-blood culture NGTD ? 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  Pathology from initial ERCP concerning for adenocarcinoma.  Pathology from repeat ERCP pending.  Bilirubin and liver enzymes improving after biliary stent.  ?Recent Labs  ?Lab 02/26/22 ?0501 02/27/22 ?0530 02/28/22 ?2952 03/01/22 ?0248 03/02/22 ?0501  ?AST 185* 129*  112* 155* 95*  ?ALT 185* 178* 158* 194* 142*  ?ALKPHOS 371* 416* 339* 369* 270*  ?BILITOT 10.1* 7.8* 8.5* 11.5* 7.9*  ?PROT 4.9* 5.1* 4.8* 5.6* 4.7*  ?ALBUMIN 2.5* 2.5* 2.4* 2.8* 2.3*  ?-Recheck in the morning ?-Patient prefers to follow-up with his oncologist in [facility] ?-Outpatient follow-up with GI in 4 to 6 months for repeat ERCP ? ? ?Hyperbilirubinemia,  transaminitis and elevated alkaline phosphatase ?See obstructive jaundice ?-Continue cholestyramine ? ?Fever ?Unclear source of infection but suspect intra-abdominal.  Infectious work-up unrevealing.  Leukocytosis improved.  No further fever. ?-Started IV Unasyn on 3/20.  Will complete 5 days empirically ? ?GERD, esophagitis, gastritis ?Noted on EUS.  ?-GI increased Protonix to twice daily ? ?Acute renal failure superimposed on stage 3a chronic kidney disease ([place]) ?Recent Labs  ?  02/23/22 ?2111 02/24/22 ?1021 02/25/22 ?8119 02/26/22 ?0501 02/27/22 ?0530 02/28/22 ?1478 03/01/22 ?0248 03/02/22 ?0501  ?BUN '22 21 22 '$ 26* 29* 36* 33* 38*  ?CREATININE 1.45* 1.36* 1.29* 1.22 1.21 1.42* 1.14 1.57*  ?Suspect prerenal etiology from poor p.o. intake ?-IV LR bolus 500 cc ?-Continue NS at 100 cc an hour ?-Avoid nephrotoxic meds ?-Recheck in the morning ? ?Macrocytic anemia ?Recent Labs  ?  02/23/22 ?2111 02/24/22 ?1021 02/25/22 ?2956 02/26/22 ?0501 02/27/22 ?0530 02/28/22 ?2130 03/01/22 ?0248 03/02/22 ?0501  ?HGB 11.7* 11.0* 10.5* 9.4* 9.9* 8.9* 10.1* 8.8*  ?Slight drop in Hgb likely dilutional.  Likely dilutional.  Anemia panel consistent with anemia of chronic disease.  Denies melena or hematochezia. ?-Monitor ? ?Bacteriuria with pyuria ?Patient denies UTI symptoms.  Urine culture with insignificant growth.  Received ceftriaxone for 2 days.  UTI ruled out.  Now with fever ?-Now on IV Unasyn. ? ?Elevated lipase ?Trended up after initial ERCP but improved. ?-Recheck in the morning ? ?Leukocytosis ?Resolved. ? ?Malnutrition of moderate degree ?Nutrition Status: ?Nutrition Problem: Moderate Malnutrition ?Etiology: acute illness (obstructive jaundice) ?Signs/Symptoms: energy intake < 75% for > 7 days, moderate fat depletion, moderate muscle depletion ?Interventions: Boost Breeze, MVI ? ?Class II obesity ?Body mass index is 35.29 kg/m?.  ? ?Metabolic acidosis ?Likely due to renal failure and IV fluid. ?Continue sodium  bicarbonate ? ?History of colorectal cancer ?Outpatient follow-up. ? ?Hyponatremia ?Resolved. ? ? ?HTN (hypertension) ?Normotensive ?-Continue holding home lisinopril and torsemide ?-Continue IV fluid ? ?Benign prostatic hyperplasia ?Not on medication. ?-Monitor urine output ? ? ? ?  ?DVT prophylaxis:  ?SCDs Start: 02/24/22 1021 ? ?Code Status: Full code ?Family Communication: Updated patient's wife and daughter at bedside ?Level of care: Telemetry ?Status is: Inpatient ?Remains inpatient appropriate because: AKI requiring IV fluid and fever requiring IV antibiotics ? ? ?Final disposition: Home with home health in the next 24 hours ? ?Consultants:  ?Gastroenterology ? ?Sch Meds:  ?Scheduled Meds: ? cholestyramine light  4 g Oral BID  ? feeding supplement  1 Container Oral TID BM  ? indomethacin  100 mg Rectal Once  ? multivitamin with minerals  1 tablet Oral Daily  ? pantoprazole  40 mg Oral BID AC  ? sodium bicarbonate  650 mg Oral TID  ? vitamin B-12  100 mcg Oral Daily  ? ?Continuous Infusions: ? sodium chloride 100 mL/hr at 03/02/22 0045  ? ampicillin-sulbactam (UNASYN) IV 3 g (03/02/22 0550)  ? ?PRN Meds:.acetaminophen **OR** acetaminophen, albuterol, alum & mag hydroxide-simeth **AND** lidocaine, diphenhydrAMINE, levalbuterol, lip balm, ondansetron **OR** ondansetron (ZOFRAN) IV, oxyCODONE ? ?Antimicrobials: ?Anti-infectives (From admission, onward)  ? ? Start     Dose/Rate Route Frequency  Ordered Stop  ? 03/01/22 1545  ampicillin-sulbactam (UNASYN) 1.5 g in sodium chloride 0.9 % 100 mL IVPB       ? 1.5 g ?200 mL/hr over 30 Minutes Intravenous  Once 03/01/22 1541 03/01/22 1757  ? 03/01/22 1200  Ampicillin-Sulbactam (UNASYN) 3 g in sodium chloride 0.9 % 100 mL IVPB       ? 3 g ?200 mL/hr over 30 Minutes Intravenous Every 6 hours 03/01/22 1032    ? 02/24/22 2200  cefTRIAXone (ROCEPHIN) 2 g in sodium chloride 0.9 % 100 mL IVPB  Status:  Discontinued       ? 2 g ?200 mL/hr over 30 Minutes Intravenous Every 24  hours 02/24/22 1025 02/26/22 1007  ? 02/24/22 0030  cefTRIAXone (ROCEPHIN) 1 g in sodium chloride 0.9 % 100 mL IVPB       ? 1 g ?200 mL/hr over 30 Minutes Intravenous  Once 02/24/22 0017 02/24/22 0100  ? ?  ? ? ? ?I have person

## 2022-03-02 NOTE — Progress Notes (Addendum)
? ? ? ? Progress Note ? ? Subjective  ?Patient doing well this AM. He denies any abdominal pain. Is eating well. Denies any respiratory complaints, has tolerated the EUS / ERCP well.  ? ? Objective  ? ?Vital signs in last 24 hours: ?Temp:  [97.7 ?F (36.5 ?C)-98.9 ?F (37.2 ?C)] 97.7 ?F (36.5 ?C) (03/21 0446) ?Pulse Rate:  [67-110] 67 (03/21 0446) ?Resp:  [14-29] 14 (03/21 0446) ?BP: (104-149)/(46-76) 109/65 (03/21 0446) ?SpO2:  [94 %-100 %] 94 % (03/21 0905) ?Weight:  [108.4 kg] 108.4 kg (03/20 1537) ?Last BM Date : 02/28/22 ?General:    [name] in NAD, jaundiced ?Abdomen:  Soft, nontender  ?Neurologic:  Alert and oriented,  grossly normal neurologically. ?Psych:  Cooperative. Normal mood and affect. ? ?Intake/Output from previous day: ?03/20 0701 - 03/21 0700 ?In: 2604.2 [P.O.:240; I.V.:2031.3; IV Piggyback:332.9] ?Out: 400 [Urine:400] ?Intake/Output this shift: ?No intake/output data recorded. ? ?Lab Results: ?Recent Labs  ?  02/28/22 ?3785 03/01/22 ?0248 03/02/22 ?0501  ?WBC 12.4* 10.5 8.2  ?HGB 8.9* 10.1* 8.8*  ?HCT 26.0* 30.2* 27.2*  ?PLT 211 211 183  ? ?BMET ?Recent Labs  ?  02/28/22 ?8850 03/01/22 ?0248 03/02/22 ?0501  ?NA 134* 134* 135  ?K 4.3 4.0 4.0  ?CL 107 110 111  ?CO2 19* 19* 17*  ?GLUCOSE 118* Eric* 178*  ?BUN 36* 33* 38*  ?CREATININE 1.42* 1.14 1.57*  ?CALCIUM 8.0* 8.0* 7.7*  ? ?LFT ?Recent Labs  ?  03/02/22 ?0501  ?PROT 4.7*  ?ALBUMIN 2.3*  ?AST 95*  ?ALT 142*  ?ALKPHOS 270*  ?BILITOT 7.9*  ? ?PT/INR ?No results for input(s): LABPROT, INR in the last 72 hours. ? ?Studies/Results: ?DG Chest 2 View ? ?Result Date: 03/01/2022 ?CLINICAL DATA:  Shortness of breath and weakness. EXAM: CHEST - 2 VIEW COMPARISON:  Chest CT 08/31/2021 FINDINGS: Lungs are clear without airspace disease, pulmonary edema or large pleural effusions. Heart size is normal. Atherosclerotic calcifications at the aortic arch. Degenerative changes in the thoracic spine. IMPRESSION: No active cardiopulmonary disease. Electronically Signed    By: Markus Daft M.D.   On: 03/01/2022 13:33  ? ?DG Chest Port 1 View ? ?Result Date: 03/01/2022 ?CLINICAL DATA:  Aspiration. EXAM: PORTABLE CHEST 1 VIEW COMPARISON:  Same day. FINDINGS: The heart size and mediastinal contours are within normal limits. Both lungs are clear. The visualized skeletal structures are unremarkable. IMPRESSION: No active disease. Electronically Signed   By: Marijo Conception M.D.   On: 03/01/2022 20:08  ? ?DG ERCP ? ?Result Date: 03/02/2022 ?CLINICAL DATA:  ERCP. EXAM: ERCP TECHNIQUE: Multiple spot images obtained with the fluoroscopic device and submitted for interpretation post-procedure. FLUOROSCOPY TIME: FLUOROSCOPY TIME 1 minute, 10 seconds (17.7 mGy) COMPARISON:  MRCP-02/24/2022 FINDINGS: Nine spot intraoperative fluoroscopic images of the right upper abdominal quadrant during ERCP are provided for review Initial image demonstrates an ERCP probe overlying the right upper abdominal quadrant Subsequent images demonstrate selective cannulation and opacification of the CBD which appears moderately dilated with tapered narrowing distally. Subsequent images demonstrate placement of an internal plastic biliary stent traversing the mid and distal aspects of the CBD with tip terminating within the descending portion of the duodenum. IMPRESSION: ERCP with biliary stenting as above. These images were submitted for radiologic interpretation only. Please see the procedural report for the amount of contrast and the fluoroscopy time utilized. Electronically Signed   By: Sandi Mariscal M.D.   On: 03/02/2022 08:24   ? ? ? ? Assessment / Plan:   ? ?  Eric [name] who presented with painless jaundice.  Initial ERCP on 3/88/8757 complicated by failed cannulation with abnormal tissue at the ampulla.  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  Subsequently had EUS with ERCP with Dr. Rush Landmark on 3/20, yesterday.  Remarkable findings include LA grade D esophagitis, gastritis, 15 mm  ampullary lesion on EUS status post FNA.  Dilated pancreatic ducts, 10 mm stricture of the CBD which was brushed and stented.  Overall concerning for malignant process.  CA 19-9 is elevated.  Patient and the family are aware of this. ? ?Fortunately he has tolerated the EUS and ERCP quite well, no abdominal pain, tolerating a diet, no evidence of post ERCP pancreatitis.  His LFTs are downtrending nicely post stent placement. He is feeling well today. ? ?Moving forward, oncology consultation has been recommended.  The patient prefers to see his oncologist in [facility] who is already established with for his history of colon cancer, they will coordinate appointment in the near future.  Dr. Rush Landmark has recommended a repeat ERCP in 4 to 6 months to exchange the stent, we will touch base with him about that for follow-up moving forward. ? ?Continue to trend LFTs while in-house, anticipate discharge in the next day or so. Continue BID PPI upon discharge for LA grade D esophagitis. I discussed the findings with the patient and his family, answered all of their questions.   ? ?Call with any questions in the interim, we will sign off for now.  ? ?Jolly Mango, MD ?[facility] ? ? ?

## 2022-03-02 NOTE — Progress Notes (Signed)
Occupational Therapy Treatment ?Patient Details ?Name: Eric [name] ?MRN: [id number] ?DOB: [birth year] ?Today's Date: 03/02/2022 ? ? ?History of present illness Patient is a 88 year old male admitted with 4 days of dark urine and pruritis. Dx of obstructive jaundice. OF NOTE: completed EUS with ERCP 3/20 and impression is concerning for malignancy PMH: HTN, BPH, colorectal cancer. ?  ?OT comments ? Patient reports wanting to get out of bed to the chair. Patient mod I for bed mobility and min G for sit to stand with walker. Patient is mildly tremulous and needing at least unilateral upper extremity support on walker to wash his face at the sink. Patient able to transfer to recliner chair with min G assist and use of walker. Patient appearing discouraged about not eating. Will continue to follow.  ? ?Recommendations for follow up therapy are one component of a multi-disciplinary discharge planning process, led by the attending physician.  Recommendations may be updated based on patient status, additional functional criteria and insurance authorization. ?   ?Follow Up Recommendations ? Home health OT  ?  ?Assistance Recommended at Discharge Intermittent Supervision/Assistance  ?Patient can return home with the following ? A little help with walking and/or transfers;A little help with bathing/dressing/bathroom;Assistance with cooking/housework;Assist for transportation;Help with stairs or ramp for entrance ?  ?Equipment Recommendations ? None recommended by OT  ?  ?   ?Precautions / Restrictions Precautions ?Precautions: Fall ?Precaution Comments: diarrhea ?Restrictions ?Weight Bearing Restrictions: No  ? ? ?  ? ?Mobility Bed Mobility ?Overal bed mobility: Modified Independent ?  ?  ?  ?  ?  ?  ?  ?  ? ? ?  ?Balance Overall balance assessment: Needs assistance ?Sitting-balance support: Feet supported ?Sitting balance-Leahy Scale: Fair ?  ?  ?Standing balance support: Single extremity supported ?Standing balance-Leahy  Scale: Poor ?  ?  ?  ?  ?  ?  ?  ?  ?  ?  ?  ?  ?   ? ?ADL either performed or assessed with clinical judgement  ? ?ADL Overall ADL's : Needs assistance/impaired ?  ?  ?Grooming: Wash/dry face;Min guard;Standing ?Grooming Details (indicate cue type and reason): Patient having difficulty maintaining balance without at least unilateral support on walker while washing his face "I'm a bit shaky today." Spouse reports patient has not been up much since procedure. ?  ?  ?  ?  ?  ?  ?  ?  ?Toilet Transfer: Min guard;Cueing for safety;Cueing for sequencing;Ambulation;Rolling walker (2 wheels) ?Toilet Transfer Details (indicate cue type and reason): To recliner chair, min G for safety ?  ?  ?  ?  ?Functional mobility during ADLs: Min guard;Rolling walker (2 wheels) ?  ?  ? ? ? ?Cognition Arousal/Alertness: Awake/alert ?Behavior During Therapy: [facility] for tasks assessed/performed ?Overall Cognitive Status: Within Functional Limits for tasks assessed ?  ?  ?  ?  ?  ?  ?  ?  ?  ?  ?  ?  ?  ?  ?  ?  ?General Comments: Patient appears a little teary eyed/emotional when talking about not being able to eat ?  ?  ?   ?   ?   ?General Comments Pt able to stand at [facility] during pericare after BM utilizing BUE on RW. Pt reported decreased sensation to perianal area so unable to determine when he is finished.  ? ? ?Pertinent Vitals/ Pain       Pain Assessment ?Pain Assessment: No/denies pain ? ?   ?   ? ?  Frequency ? Min 2X/week  ? ? ? ? ?  ?Progress Toward Goals ? ?OT Goals(current goals can now be found in the care plan section) ? Progress towards OT goals: Progressing toward goals ? ?Acute Rehab OT Goals ?Patient Stated Goal: Feel better ?OT Goal Formulation: With patient ?Time For Goal Achievement: 03/11/22 ?Potential to Achieve Goals: Good ?ADL Goals ?Pt Will Perform Lower Body Dressing: with modified independence;sit to/from stand;sitting/lateral leans ?Pt Will Transfer to Toilet: with modified independence;ambulating (elevated toilet,  walker) ?Pt Will Perform Toileting - Clothing Manipulation and hygiene: with modified independence;sit to/from stand;sitting/lateral leans ?Additional ADL Goal #1: Patient will tolerate 10 minutes dynamic standing activity in order to participate in daily routine.  ?Plan Discharge plan remains appropriate   ? ?   ?AM-PAC OT "6 Clicks" Daily Activity     ?Outcome Measure ? ? Help from another person eating meals?: Total (NPO) ?Help from another person taking care of personal grooming?: A Little ?Help from another person toileting, which includes using toliet, bedpan, or urinal?: A Lot ?Help from another person bathing (including washing, rinsing, drying)?: A Little ?Help from another person to put on and taking off regular upper body clothing?: A Little ?Help from another person to put on and taking off regular lower body clothing?: A Little ?6 Click Score: 15 ? ?  ?End of Session Equipment Utilized During Treatment: Rolling walker (2 wheels) ? ?OT Visit Diagnosis: Unsteadiness on feet (R26.81) ?  ?Activity Tolerance Patient tolerated treatment well ?  ?Patient Left in chair;with call bell/phone within reach;with chair alarm set;with family/visitor present ?  ?Nurse Communication Mobility status ?  ? ?   ? ?Time: 4193-7902 ?OT Time Calculation (min): 19 min ? ?Charges: OT General Charges ?$OT Visit: 1 Visit ?OT Treatments ?$Self Care/Home Management : 8-22 mins ? ?Delbert Phenix OT ?OT pager: [phone] ? ? ?Rosemary Holms ?03/02/2022, 12:54 PM ?

## 2022-03-02 NOTE — TOC Initial Note (Signed)
Transition of Care (TOC) - Initial/Assessment Note  ? ? ?Patient Details  ?Name: Eric [name] ?MRN: [id number] ?Date of Birth: May 13, 1929 ? ?Transition of Care (TOC) CM/SW Contact:    ?Kemia Wendel, Marjie Skiff, RN ?Phone Number: ?03/02/2022, 1:35 PM ? ?Clinical Narrative:                 ?Spoke with pt and wife at bedside for dc planning. Choice offered for home health PT and [facility] chosen. [facility] liaison contacted for referral. Pt requesting 6ZT2 and [place]. [employer] contact for DME requests. ? ?Expected Discharge Plan: [place] ?Barriers to Discharge: Continued Medical Work up ? ? ?Patient Goals and CMS Choice ?Patient states their goals for this hospitalization and ongoing recovery are:: To go home ?CMS Medicare.gov Compare Post Acute Care list provided to:: Patient ?Choice offered to / list presented to : Patient ? ?Expected Discharge Plan and Services ?Expected Discharge Plan: [place] ?  ?Discharge Planning Services: CM Consult ?Post Acute Care Choice: Home Health ?Living arrangements for the past 2 months: [place] ?                ?DME Arranged: Walker rolling with seat, 3-N-1 ?DME Agency: [employer] ?Date DME Agency Contacted: 03/01/22 ?Time DME Agency Contacted: 1300 ?Representative spoke with at DME Agency: Andee Poles ?HH Arranged: PT ?[place] Agency: [place] ?Date HH Agency Contacted: 03/02/22 ?Time [place]: 4580 ?Representative spoke with at [place]: Tommi Rumps ? ?Prior Living Arrangements/Services ?Living arrangements for the past 2 months: [place] ?Lives with:: Spouse ?Patient language and need for interpreter reviewed:: Yes ?Do you feel safe going back to the place where you live?: Yes      ?  ?Care giver support system in place?: Yes (comment) ?  ?Criminal Activity/Legal Involvement Pertinent to Current Situation/Hospitalization: No - Comment as needed ? ?Activities of Daily Living ?Home Assistive Devices/Equipment: Dentures  (specify type), Walker (specify type), Grab bars in shower, Grab bars around toilet ?ADL Screening (condition at time of admission) ?Patient's cognitive ability adequate to safely complete daily activities?: Yes ?Is the patient deaf or have difficulty hearing?: No ?Does the patient have difficulty seeing, even when wearing glasses/contacts?: No ?Does the patient have difficulty concentrating, remembering, or making decisions?: No ?Patient able to express need for assistance with ADLs?: Yes ?Does the patient have difficulty dressing or bathing?: Yes ?Independently performs ADLs?: Yes (appropriate for developmental age) ?Does the patient have difficulty walking or climbing stairs?: Yes ?Weakness of Legs: Both ?Weakness of Arms/Hands: None ? ?Permission Sought/Granted ?Permission sought to share information with : Customer service manager ?Permission granted to share information with : Yes, Verbal Permission Granted ?   ? Permission granted to share info w AGENCY: Alvis Lemmings ?   ?   ? ?Emotional Assessment ?Appearance:: Appears stated age ?Attitude/Demeanor/Rapport: Self-Confident ?Affect (typically observed): Calm ?Orientation: : Oriented to Self, Oriented to Place, Oriented to  Time, Oriented to Situation ?Alcohol / Substance Use: Not Applicable ?Psych Involvement: No (comment) ? ?Admission diagnosis:  Hyperbilirubinemia [E80.6] ?Jaundice [R17] ?Acute cystitis without hematuria [N30.00] ?Patient Active Problem List  ? Diagnosis Date Noted  ? Esophagitis 03/02/2022  ? Gastritis 03/02/2022  ? Fever 03/01/2022  ? 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 Leukocytosis 02/28/2022  ? GERD, esophagitis, gastritis 02/28/2022  ? Jaundice   ? Mass of ampulla of [place]   ? Metabolic acidosis 99/83/3825  ? Class II obesity 02/25/2022  ?  Malnutrition of moderate degree 02/25/2022  ? Abnormal MRI of abdomen   ? 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 Hyperbilirubinemia, transaminitis and elevated alkaline phosphatase 02/24/2022  ? Elevated LFTs 02/24/2022  ? Elevated lipase 02/24/2022  ? Bacteriuria with pyuria 02/24/2022  ? HTN (hypertension) 02/24/2022  ? Hyponatremia 02/24/2022  ? Macrocytic anemia 02/24/2022  ? History of colorectal cancer 02/24/2022  ? GI bleed 05/05/2018  ? Primary osteoarthritis of right knee 04/19/2018  ? Osteoarthritis of right knee 04/18/2018  ? Acute renal failure superimposed on stage 3a chronic kidney disease ([place]) 03/31/2018  ? Choroidal nevus of left eye 01/18/2017  ? Benign prostatic hyperplasia 11/19/2015  ? Hypertensive retinopathy of both eyes 11/19/2015  ? Spinal stenosis 11/19/2015  ? ?PCP:  Irish Lack., PA-C ?Pharmacy:   ?[facility] [address], [place] ?[place] ?[place] [address] ?Phone: [phone] Fax: [phone] ? ?[facility] [address] [place], [place]. AT [place] ?[place]. [place] [address] ?Phone: [phone] Fax: [phone] ? ? ? ? ?Social Determinants of Health (SDOH) Interventions ?  ? ?Readmission Risk Interventions ?Readmission Risk Prevention Plan 03/02/2022  ?Transportation Screening Complete  ?PCP or Specialist Appt within 5-7 Days Complete  ?Home Care Screening Complete  ?Medication Review (RN CM) Complete  ?Some recent data might be hidden  ? ? ? ?

## 2022-03-02 NOTE — Progress Notes (Signed)
Physical Therapy Treatment ?Patient Details ?Name: Eric [name] ?MRN: [id number] ?DOB: June 17, 1929 ?Today's Date: 03/02/2022 ? ? ?History of Present Illness Patient is a 88 year old male admitted with 4 days of dark urine and pruritis. Dx of obstructive jaundice. OF NOTE: completed EUS with ERCP 3/20 and impression is concerning for malignancy PMH: HTN, BPH, colorectal cancer. ? ?  ?PT Comments  ? ? Pt  seated in recliner upon entry and agreeable to be seen. Orthostatic vitals taken, see flowsheets or table copied below. Pt min guard for all mobility tasks today for safety and VCs only, no physical assist required. Ambulated at EOB, transferred from recliner to [facility] and back, and completed LE exercises. Further mobility deferred secondary to fatigue. We will continue to follow pt acutely to promote independence with functional mobility. ? ? 03/02/22 1220  ?Orthostatic Sitting  ?BP- Sitting 118/54  ?Pulse- Sitting 84  ?Orthostatic Standing at 0 minutes  ?BP- Standing at 0 minutes 115/57  ?Pulse- Standing at 0 minutes 92  ?  ? ?Recommendations for follow up therapy are one component of a multi-disciplinary discharge planning process, led by the attending physician.  Recommendations may be updated based on patient status, additional functional criteria and insurance authorization. ? ?Follow Up Recommendations ? Home health PT ?  ?  ?Assistance Recommended at Discharge Intermittent Supervision/Assistance  ?Patient can return home with the following A little help with bathing/dressing/bathroom;Assistance with cooking/housework;Assist for transportation;A little help with walking and/or transfers;Help with stairs or ramp for entrance ?  ?Equipment Recommendations ? None recommended by PT  ?  ?Recommendations for Other Services   ? ? ?  ?Precautions / Restrictions Precautions ?Precautions: Fall ?Precaution Comments: diarrhea ?Restrictions ?Weight Bearing Restrictions: No  ?  ? ?Mobility ? Bed Mobility ?  ?  ?  ?  ?  ?  ?   ?General bed mobility comments: Pt in recliner upon entry and exit ?  ? ?Transfers ?Overall transfer level: Needs assistance ?Equipment used: Rolling walker (2 wheels) ?Transfers: Sit to/from Stand, Bed to chair/wheelchair/BSC ?Sit to Stand: Min guard ?  ?Step pivot transfers: Min guard ?  ?  ?  ?General transfer comment: Pt min guard for sit to stand transfer and step pivot transfer for safety only, no physical assist required. VCs to use hands on recliner arms to power up and to scoot feet backward prior to standing. Pt completed step pivot transfer from recliner to BSC min guard. ?  ? ?Ambulation/Gait ?Ambulation/Gait assistance: Min guard ?Gait Distance (Feet): 8 Feet ?Assistive device: Rolling walker (2 wheels) ?Gait Pattern/deviations: Step-through pattern, Decreased stride length ?Gait velocity: decreased ?  ?  ?General Gait Details: Pt ambulated EOB forward and backward 1x with min guard for safety only, no physical assist, just line management. No overt LOB. Demonstrated step-through pattern with decreased stride length. Futher ambulation deferred secondary to pt verbalizing need to defecate. ? ? ?Stairs ?  ?  ?  ?  ?  ? ? ?Wheelchair Mobility ?  ? ?Modified Rankin (Stroke Patients Only) ?  ? ? ?  ?Balance Overall balance assessment: Needs assistance ?Sitting-balance support: Feet supported ?Sitting balance-Leahy Scale: Fair ?  ?  ?Standing balance support: Bilateral upper extremity supported, Reliant on assistive device for balance, During functional activity ?Standing balance-Leahy Scale: Poor ?Standing balance comment: Pt reliant on BUE support on RW during functional mobility tasks. Pt was able to release single UE during static standing orthostatic vitals. ?  ?  ?  ?  ?  ?  ?  ?  ?  ?  ?  ?  ? ?  ?  Cognition Arousal/Alertness: Awake/alert ?Behavior During Therapy: [facility] for tasks assessed/performed ?Overall Cognitive Status: Within Functional Limits for tasks assessed ?  ?  ?  ?  ?  ?  ?  ?  ?  ?  ?  ?   ?  ?  ?  ?  ?  ?  ?  ? ?  ?Exercises Other Exercises ?Other Exercises: Sit to stand: x8 pt utilized BUE on recliner arms to power up and after set was fatigued and SOB; VSS. ? ?  ?General Comments General comments (skin integrity, edema, etc.): Pt able to stand at [facility] during pericare after BM utilizing BUE on RW. Pt reported decreased sensation to perianal area so unable to determine when he is finished. ?  ?  ? ?Pertinent Vitals/Pain Pain Assessment ?Pain Assessment: No/denies pain  ? ? ?Home Living   ?  ?  ?  ?  ?  ?  ?  ?  ?  ?   ?  ?Prior Function    ?  ?  ?   ? ?PT Goals (current goals can now be found in the care plan section) Acute Rehab PT Goals ?Patient Stated Goal: return to independence with mobility ?PT Goal Formulation: With patient ?Time For Goal Achievement: 03/12/22 ?Potential to Achieve Goals: Good ?Progress towards PT goals: Progressing toward goals ? ?  ?Frequency ? ? ? Min 3X/week ? ? ? ?  ?PT Plan Current plan remains appropriate  ? ? ?Co-evaluation   ?  ?  ?  ?  ? ?  ?AM-PAC PT "6 Clicks" Mobility   ?Outcome Measure ? Help needed turning from your back to your side while in a flat bed without using bedrails?: A Little ?Help needed moving from lying on your back to sitting on the side of a flat bed without using bedrails?: A Lot ?Help needed moving to and from a bed to a chair (including a wheelchair)?: A Little ?Help needed standing up from a chair using your arms (e.g., wheelchair or bedside chair)?: A Little ?Help needed to walk in hospital room?: A Little ?Help needed climbing 3-5 steps with a railing? : A Lot ?6 Click Score: 16 ? ?  ?End of Session Equipment Utilized During Treatment: Gait belt ?Activity Tolerance: Patient limited by fatigue ?Patient left: in chair;with call bell/phone within reach;with family/visitor present;with chair alarm set ?Nurse Communication: Mobility status ?PT Visit Diagnosis: Difficulty in walking, not elsewhere classified (R26.2) ?  ? ? ?Time: 4742-5956 ?PT  Time Calculation (min) (ACUTE ONLY): 24 min ? ?Charges:  $Therapeutic Activity: 8-22 mins          ?          ? ?Coolidge Breeze, PT, DPT ?[facility] Department ?Office: [phone] ?Pager: [phone] ? ? ?Eric [name] ?03/02/2022, 12:39 PM ? ?

## 2022-03-03 ENCOUNTER — Encounter: Payer: Self-pay | Admitting: Gastroenterology

## 2022-03-03 DIAGNOSIS — K831 Obstruction of bile duct: Secondary | ICD-10-CM | POA: Diagnosis not present

## 2022-03-03 DIAGNOSIS — N400 Enlarged prostate without lower urinary tract symptoms: Secondary | ICD-10-CM | POA: Diagnosis not present

## 2022-03-03 DIAGNOSIS — E669 Obesity, unspecified: Secondary | ICD-10-CM | POA: Diagnosis not present

## 2022-03-03 DIAGNOSIS — N179 Acute kidney failure, unspecified: Secondary | ICD-10-CM | POA: Diagnosis not present

## 2022-03-03 LAB — COMPREHENSIVE METABOLIC PANEL
ALT: 120 U/L — ABNORMAL HIGH (ref 0–44)
AST: 66 U/L — ABNORMAL HIGH (ref 15–41)
Albumin: 2.3 g/dL — ABNORMAL LOW (ref 3.5–5.0)
Alkaline Phosphatase: 239 U/L — ABNORMAL HIGH (ref 38–126)
Anion gap: 6 (ref 5–15)
BUN: 40 mg/dL — ABNORMAL HIGH (ref 8–23)
CO2: 18 mmol/L — ABNORMAL LOW (ref 22–32)
Calcium: 7.7 mg/dL — ABNORMAL LOW (ref 8.9–10.3)
Chloride: 111 mmol/L (ref 98–111)
Creatinine, Ser: 1.44 mg/dL — ABNORMAL HIGH (ref 0.61–1.24)
GFR, Estimated: 46 mL/min — ABNORMAL LOW (ref >60)
Glucose, Bld: 177 mg/dL — ABNORMAL HIGH (ref 70–99)
Potassium: 3.5 mmol/L (ref 3.5–5.1)
Sodium: 135 mmol/L (ref 135–145)
Total Bilirubin: 4.8 mg/dL — ABNORMAL HIGH (ref 0.3–1.2)
Total Protein: 4.5 g/dL — ABNORMAL LOW (ref 6.5–8.1)

## 2022-03-03 LAB — CBC
HCT: 24.6 % — ABNORMAL LOW (ref 39.0–52.0)
Hemoglobin: 8.2 g/dL — ABNORMAL LOW (ref 13.0–17.0)
MCH: 35.7 pg — ABNORMAL HIGH (ref 26.0–34.0)
MCHC: 33.3 g/dL (ref 30.0–36.0)
MCV: 107 fL — ABNORMAL HIGH (ref 80.0–100.0)
Platelets: 181 10*3/uL (ref 150–400)
RBC: 2.3 MIL/uL — ABNORMAL LOW (ref 4.22–5.81)
RDW: 18.3 % — ABNORMAL HIGH (ref 11.5–15.5)
WBC: 9.2 10*3/uL (ref 4.0–10.5)
nRBC: 0.2 % (ref 0.0–0.2)

## 2022-03-03 LAB — MAGNESIUM: Magnesium: 2 mg/dL (ref 1.7–2.4)

## 2022-03-03 LAB — PHOSPHORUS: Phosphorus: 3.1 mg/dL (ref 2.5–4.6)

## 2022-03-03 LAB — LIPASE, BLOOD: Lipase: 198 U/L — ABNORMAL HIGH (ref 11–51)

## 2022-03-03 MED ORDER — PANTOPRAZOLE SODIUM 40 MG PO TBEC: 40.0000 mg | DELAYED_RELEASE_TABLET | Freq: Two times a day (BID) | ORAL | 0 refills | Status: DC

## 2022-03-03 MED ORDER — SODIUM BICARBONATE 650 MG PO TABS: 650.0000 mg | ORAL_TABLET | Freq: Three times a day (TID) | ORAL | 0 refills | Status: AC

## 2022-03-03 MED ORDER — AMOXICILLIN-POT CLAVULANATE 875-125 MG PO TABS: 1.0000 | ORAL_TABLET | Freq: Two times a day (BID) | ORAL | 0 refills | Status: AC

## 2022-03-03 MED ORDER — AMOXICILLIN-POT CLAVULANATE 875-125 MG PO TABS: 1.0000 | ORAL_TABLET | Freq: Two times a day (BID) | ORAL | 0 refills | Status: DC

## 2022-03-03 MED ORDER — PANTOPRAZOLE SODIUM 40 MG PO TBEC: 40.0000 mg | DELAYED_RELEASE_TABLET | Freq: Two times a day (BID) | ORAL | 0 refills | Status: AC

## 2022-03-03 MED ORDER — SODIUM BICARBONATE 650 MG PO TABS: 650.0000 mg | ORAL_TABLET | Freq: Three times a day (TID) | ORAL | 0 refills | Status: DC

## 2022-03-03 NOTE — Discharge Summary (Signed)
Physician Discharge Summary  ?Hussain Maimone Kolbe GTX:[id number] DOB: [birth year] DOA: 02/23/2022 ? ?PCP: Irish Lack., PA-C ? ?Admit date: 02/23/2022 ?Discharge date: 03/03/2022 ? ?Admitted From: Home ?Disposition: Home ? ?Recommendations for Outpatient Follow-up:  ?Follow up with PCP in 1 week with repeat CBC/CMP ?Outpatient follow-up with GI and oncology ?Might need outpatient palliative care evaluation and follow-up for goals of care discussion ?Follow up in ED if symptoms worsen or new appear ? ? ?Home Health: PT/OT ?Equipment/Devices: None ? ?Discharge Condition: Guarded ?CODE STATUS: Full ?Diet recommendation: Heart healthy ? ?Brief/Interim Summary: ?88 year old M with PMH of colon rectal cancer, CKD-3B, HTN and BPH presented with dark urine/pruritus and unintentional weight loss and was admitted for  obstructive jaundice with elevated liver enzymes and hyperbilirubinemia.  CT A/P with several small scattered calcified liver granuloma, mild biliary ductal dilation but no calcified gallstone or pericholecystic fluid.  RUQ [place] with cholelithiasis without acute cholecystitis.  GI consulted.  MRCP with cholelithiasis, mild increase in caliber of CBD and main pancreatic duct up to the level of ampulla but no choledocholithiasis or obstructing mass.  ? ?Patient underwent ERCP on 3/17 but not able to cannulate duct.  Biopsies were taken.  Pathology with high-grade dysplasia concerning for focal invasive adenocarcinoma.  Subsequently had EUS with ERCP with Dr. Rush Landmark on 3/20, and found to have LA grade D esophagitis, gastritis, 15 mm ampullary lesion on EUS status post FNA, dilated pancreatic ducts, 10 mm stricture of the CBD which was brushed and stented. CA 19-9 is elevated.  GI recommended follow-up with oncology.  Patient prefers to follow-up with his oncologist in [facility].  GI recommended repeat ERCP in 4 to 6 months for stent exchange, and signed off. ? ?Patient subsequently spiked temperature for which she  was started on IV Unasyn.  Cultures have been negative so far.  Subsequently, he has remained afebrile and LFTs are improving.  He will be discharged home today with home health PT on oral Augmentin for 5 more days.  Outpatient follow-up with PCP/GI and oncology. ? ? ?Discharge Diagnoses:   ? 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  Pathology from initial ERCP concerning for adenocarcinoma.  Pathology from repeat ERCP pending.  Bilirubin and liver enzymes improving after biliary stent.  ?-LFTs improving.  Will need outpatient follow-up of LFTs. ?--Patient prefers to follow-up with his oncologist in [facility] ?-Outpatient follow-up with GI in 4 to 6 months for repeat ERCP ? ?Fever ?-Patient was started on IV Unasyn for possible intra-abdominal source.  Infectious work-up unrevealing so far.  Leukocytosis is resolved.  Discharge home on oral Augmentin for 5 more days. ? ?GERD/esophagitis/gastritis ?-Noted on EUS.  Continue Protonix twice daily on discharge.  Outpatient follow-up with GI. ? ?AKI on CKD stage IIIa ?-Currently on IV fluids.  Creatinine improving.  Outpatient follow-up of creatinine. ? ?Macrocytic anemia/anemia of chronic disease ?-Hemoglobin currently stable.  Outpatient follow-up ? ?Elevated lipase ?-No abdominal pain.  Tolerating diet.  Lipase only minimally elevated. ? ?Leukocytosis ?-Resolved ? ?Moderate malnutrition ?-Follow nutrition recommendations ? ?Class II obesity ?-Outpatient follow-up ? ?Acute metabolic acidosis ?-Improving.  Continue oral sodium bicarb tablets on discharge.  Outpatient follow-up with PCP ? ?Hyponatremia ?-Resolved ? ?Hypertension ?-Blood pressure intermittently on the higher side.  Outpatient follow-up with PCP.  Home regimen on hold for now. ? ?BPH ?-Not on any medication.  Outpatient follow-up ? ?Physical deconditioning ?-Will need home health PT.  Recommend outpatient palliative care evaluation and follow-up for goals of care discussion. ? ?Discharge Instructions ? ?Discharge Instructions   ? ? Ambulatory referral to Gastroenterology   Complete by: As directed ?  ? Diet - low sodium heart healthy   Complete by: As directed ?  ? Increase activity slowly   Complete by: As directed ?  ? ?  ? ?Allergies as of 03/03/2022   ? ?   Reactions  ? Nsaids Other (See Comments)  ? Renal insufficiency  ? Sulfa Antibiotics Other (See Comments)  ? Renal insufficiency at age 88  ? Tolmetin Other (See Comments)  ? Renal insufficiency  ? ?  ? ?  ?Medication List  ?  ? ?STOP taking these medications   ? ?acetaminophen 500 MG tablet ?Commonly known as: TYLENOL ?  ?cholestyramine 4 g packet ?Commonly known as: QUESTRAN ?  ?lisinopril 10 MG tablet ?Commonly known as: ZESTRIL ?  ?saccharomyces boulardii 250 MG capsule ?Commonly known as: FLORASTOR ?  ?torsemide 10 MG tablet ?Commonly known as: DEMADEX ?  ? ?  ? ?TAKE these medications   ? ?amoxicillin-clavulanate 875-125 MG tablet ?Commonly known as: Augmentin ?Take 1 tablet by mouth 2 (two) times daily for 5 days. ?  ?METAMUCIL FIBER PO ?Take 1 capsule by mouth See admin instructions. meals 2-3 times daily ?  ?pantoprazole 40 MG tablet ?Commonly known as: Protonix ?Take 1 tablet (40 mg total) by mouth 2 (two) times daily before a meal. ?What changed: when to take this ?  ?sodium bicarbonate 650 MG tablet ?Take 1 tablet (650 mg total) by mouth 3 (three) times daily. ?  ?VITAMIN B-12 PO ?Take 1 tablet by mouth daily. ?  ?VITAMIN D (CHOLECALCIFEROL) PO ?Take 1 capsule by mouth daily. ?  ? ?  ? ?  ?  ? ? ?  ?Durable Medical Equipment  ?(From admission, onward)  ?  ? ? ?  ? ?  Start     Ordered  ? 03/01/22 1416  For home use only DME 4 wheeled rolling walker with seat  Once       ?Question:  Patient needs a walker to treat with the following condition  Answer:  Weakness  ? 03/01/22 1415  ? 03/01/22 1415  For home use only DME 3 n 1  Once        ? 03/01/22 1415  ? ?  ?  ? ?  ? ? ? ? ? ? Follow-up Information   ? ? Care, [facility] Follow up.   ?Specialty: Home Health Services ?Contact information: ?[address] ?[address] ?[place] [address] ?[phone] ? ? ?  ?  ? ? Irish Lack., PA-C. Schedule an appointment as soon as possible for a visit in 1 week(s).   ?Specialty: Internal Medicine ?Why: With repeat CBC/CMP ?Contact information: ?[address] ?[address] ?[place] [address] ?[phone] ? ? ?  ?  ? ?  ?  ? ?  ? ?Allergies  ?Allergen Reactions  ? Nsaids Other (See Comments)  ?  Renal insufficiency ?  ? Sulfa Antibiotics Other (See Comments)  ?  Renal insufficiency at age 26  ? Tolmetin Other (See Comments)  ?  Renal insufficiency ?  ? ? ?Consultations: ?GI ? ? ?Procedures/Studies: ?DG Chest 2 View ? ?Result Date: 03/01/2022 ?CLINICAL DATA:  Shortness of breath and weakness. EXAM: CHEST - 2 VIEW COMPARISON:  Chest CT 08/31/2021 FINDINGS: Lungs are clear without airspace disease, pulmonary edema or large  pleural effusions. Heart size is normal. Atherosclerotic calcifications at the aortic arch. Degenerative changes in the thoracic spine. IMPRESSION: No active cardiopulmonary disease. Electronically Signed   By: Markus Daft M.D.   On: 03/01/2022 13:33  ? ?CT Abdomen Pelvis W Contrast ? ?Result Date: 02/23/2022 ?CLINICAL DATA:  Abdominal pain. EXAM: CT ABDOMEN AND PELVIS WITH CONTRAST TECHNIQUE: Multidetector CT imaging of the abdomen and pelvis was performed using the standard protocol following bolus administration of intravenous contrast. RADIATION DOSE REDUCTION: This exam was performed according to the departmental dose-optimization program which includes automated exposure control, adjustment of the mA and/or kV according to patient size and/or use of iterative reconstruction technique. CONTRAST:  1101m OMNIPAQUE IOHEXOL 300 MG/ML  SOLN COMPARISON:  CT abdomen pelvis dated 08/31/2021. FINDINGS: Lower chest: The visualized  lung bases are clear. There is coronary vascular calcification. No intra-abdominal free air or free fluid. Hepatobiliary: Several small scattered calcified liver granuloma. The liver is otherwise unrem

## 2022-03-04 LAB — SURGICAL PATHOLOGY

## 2022-03-04 LAB — CYTOLOGY - NON PAP

## 2022-03-06 LAB — CULTURE, BLOOD (ROUTINE X 2)
Culture: NO GROWTH
Culture: NO GROWTH
Special Requests: ADEQUATE
Special Requests: ADEQUATE

## 2022-04-19 ENCOUNTER — Telehealth: Payer: Self-pay

## 2022-04-20 NOTE — Telephone Encounter (Signed)
FYI The pt passed away yesterday.  ?

## 2022-04-20 NOTE — Telephone Encounter (Signed)
Thank you for update. ?Sorry to hear this. ?He can be removed from our list of recalls. ?GM ?

## 2022-05-13 NOTE — Telephone Encounter (Signed)
-----   Message from Eric [name]., MD sent at 04/18/2022 10:44 PM EDT ----- ?Regarding: Follow-up biliary stent ?Eric [name], ?Looking through the chart it seems that the patient was transition to home with hospice.  Not sure if you can reach out to hospice services and see if the patient is still with [place] or if he has passed away.  We just need to have an understanding so that I know if there is any need for enteral stent exchange or not (suspect not). ?Thanks. ?GM ? ?

## 2022-05-13 NOTE — Telephone Encounter (Signed)
Left message on machine to call back  

## 2022-05-13 DEATH — deceased

## 2022-09-26 IMAGING — CT CT ABD-PELV W/ CM
2 of 5 series · 16 of 46 positions shown, 18 images · IV contrast (Omnipaque)
Comparison: CT abdomen pelvis dated 08/31/2021.

CLINICAL DATA: Abdominal pain.

EXAM:
CT ABDOMEN AND PELVIS WITH CONTRAST
TECHNIQUE: Multidetector CT imaging of the abdomen and pelvis was performed
using the standard protocol following bolus administration of
intravenous contrast.

[Series 2: axial (person_name) (person_name) · axial · 0.98mm/px · z∈[+337,+817]mm · 13 of 110 slices shown, 15 images]
[im 7/110  soft-tissue]
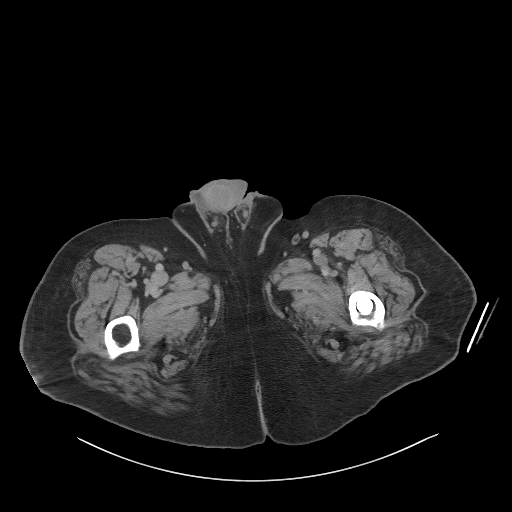
[im 7/110  bone]
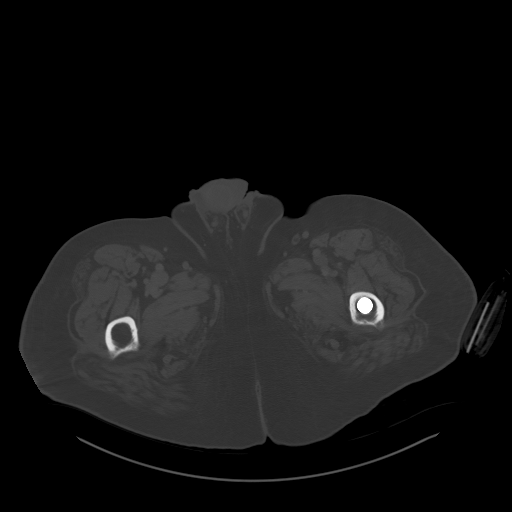
[im 13/110  soft-tissue]
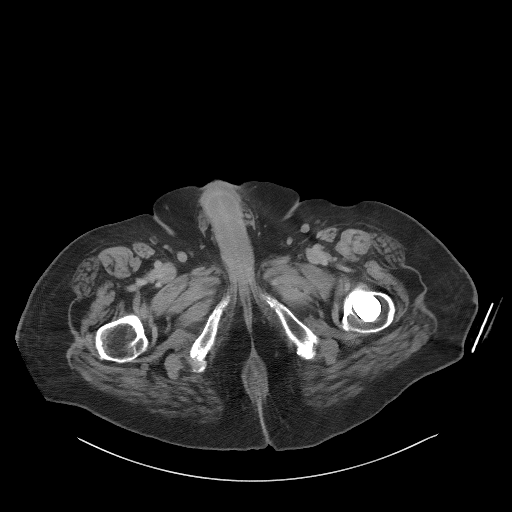
[im 25/110  soft-tissue]
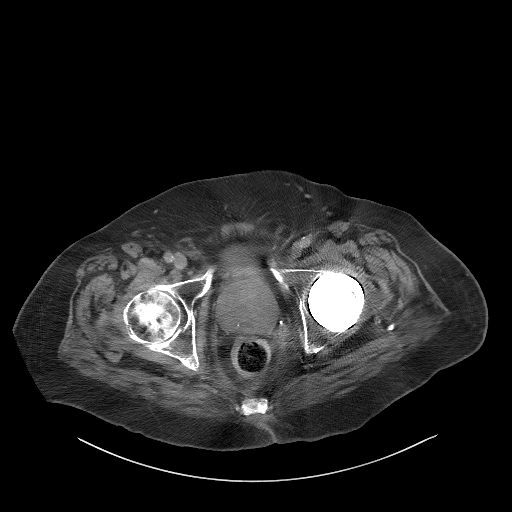
[im 31/110  soft-tissue]
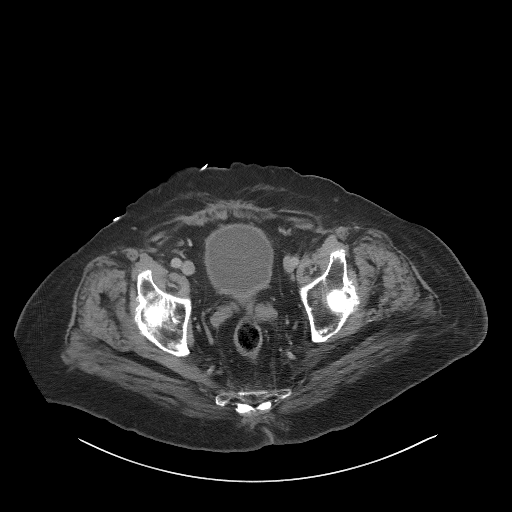
[im 37/110  soft-tissue]
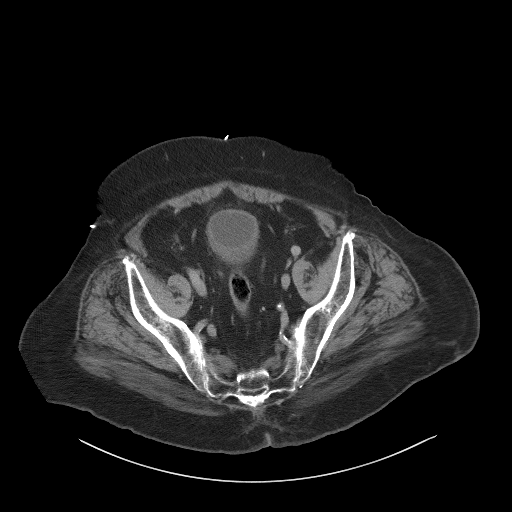
[im 49/110  soft-tissue]
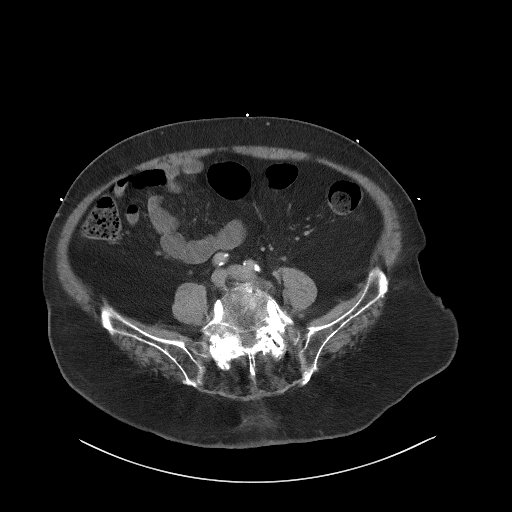
[im 55/110  soft-tissue]
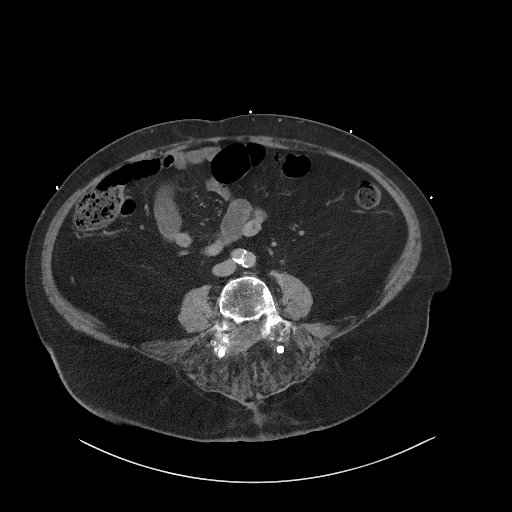
[im 61/110  soft-tissue]
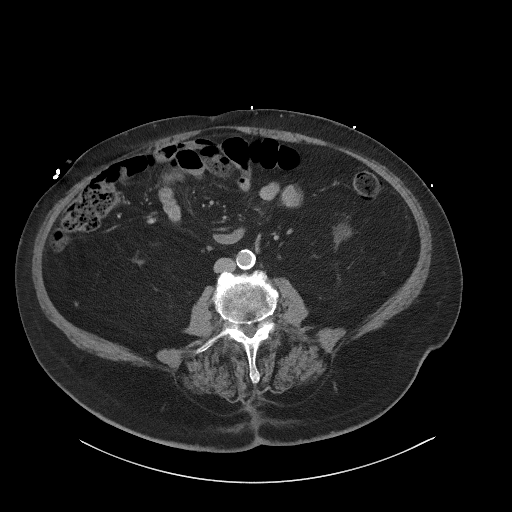
[im 73/110  soft-tissue]
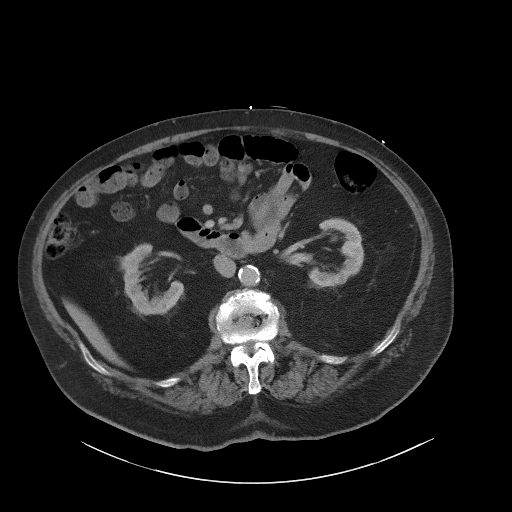
[im 73/110  bone]
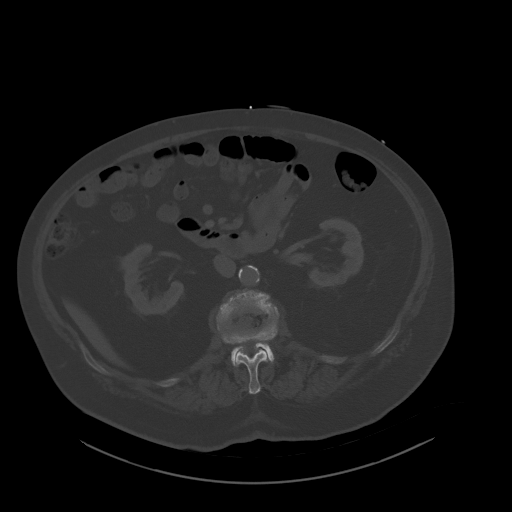
[im 79/110  soft-tissue]
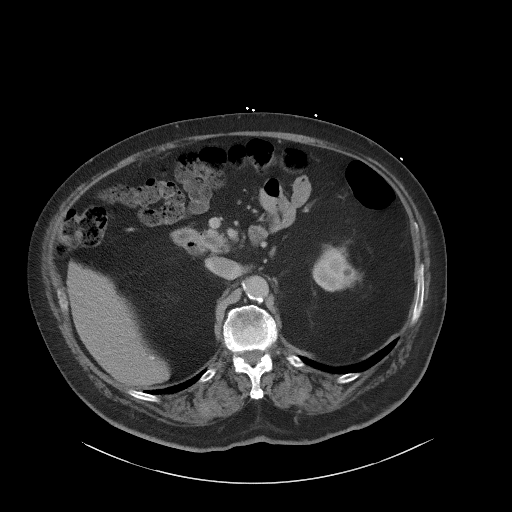
[im 85/110  soft-tissue]
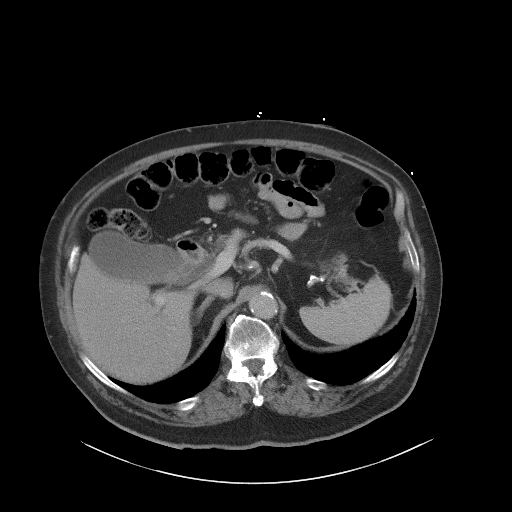
[im 97/110  soft-tissue]
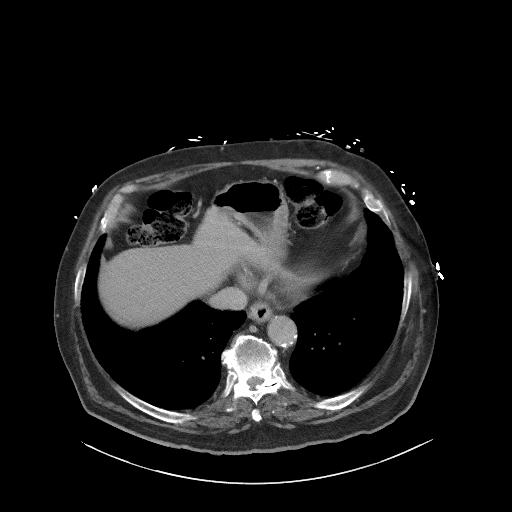
[im 103/110  soft-tissue]
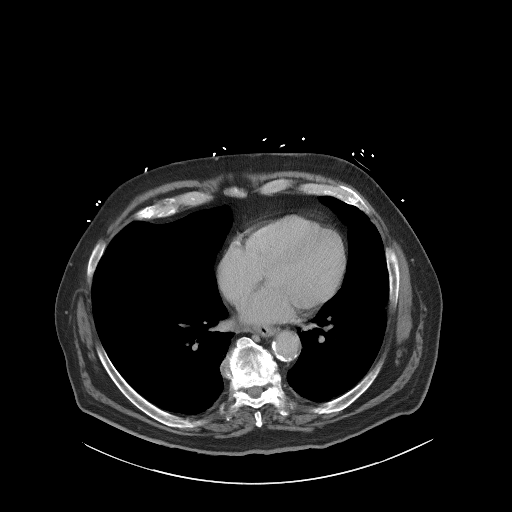

[Series 5: coronal st · coronal · 0.88mm/px · 3 of 106 slices shown]
[im 36/106  soft-tissue]
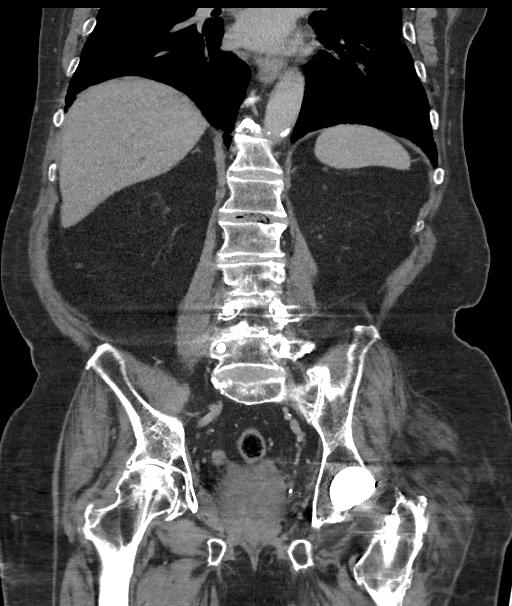
[im 47/106  soft-tissue]
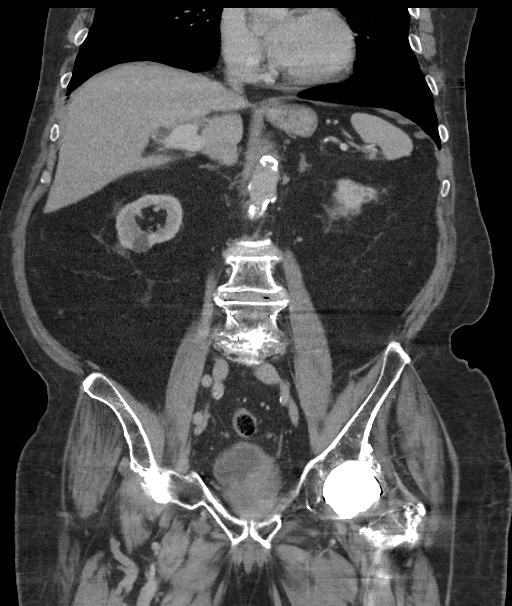
[im 59/106  soft-tissue]
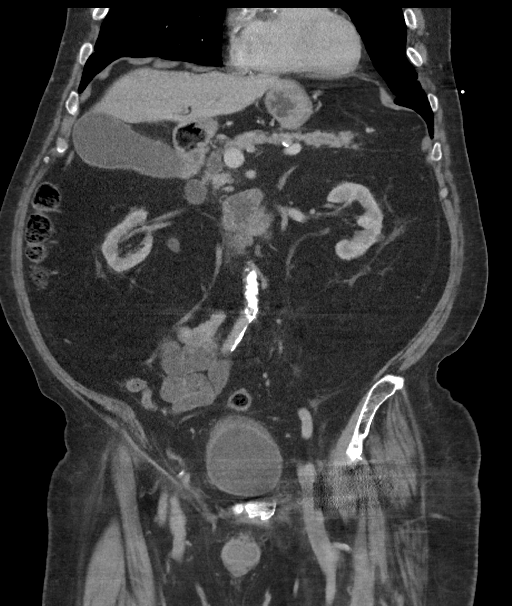

[16 of 46 positions shown; findings below may reference images not displayed]

RADIATION DOSE REDUCTION: This exam was performed according to the
departmental dose-optimization program which includes automated
exposure control, adjustment of the mA and/or kV according to
patient size and/or use of iterative reconstruction technique.

CONTRAST:  100mL OMNIPAQUE IOHEXOL 300 MG/ML  SOLN
FINDINGS: Lower chest: The visualized lung bases are clear. There is coronary
vascular calcification.

No intra-abdominal free air or free fluid.

Hepatobiliary: Several small scattered calcified liver granuloma.
The liver is otherwise unremarkable. There is mild biliary ductal
dilatation. No calcified gallstone or pericholecystic fluid.

Pancreas: The pancreas is mildly atrophic. There is mild dilatation
of the main pancreatic duct. No active inflammatory changes.

Spleen: Normal in size without focal abnormality.

Adrenals/Urinary Tract: The adrenal glands unremarkable. Mild
bilateral renal parenchyma atrophy. There is no hydronephrosis on
either side. There is symmetric enhancement and excretion of
contrast by both kidneys. There is a 2 cm left renal inferior pole
cyst. Several additional subcentimeter hypodense lesions are too
small to characterize. The visualized ureters appear unremarkable.
There is mild trabeculated appearance of the bladder wall likely
related to chronic bladder outlet obstruction. Correlation with
urinalysis recommended to exclude superimposed UTI.

Stomach/Bowel: There is no bowel obstruction or active inflammation.
The appendix is normal.

Vascular/Lymphatic: Advanced aortoiliac atherosclerotic disease. The
IVC is unremarkable. No portal venous gas. There is no adenopathy.

Reproductive: Enlarged prostate gland measuring 6 cm in transverse
axial diameter. The seminal vesicles are symmetric.

Other: None

Musculoskeletal: Osteopenia with degenerative changes of the spine
and lower lumbar fusion. Total left hip arthroplasty. No acute
osseous pathology.
IMPRESSION: 1. No acute intra-abdominal or pelvic pathology. No bowel
obstruction. Normal appendix.
2. Enlarged prostate gland with findings of chronic bladder outlet
obstruction. Correlation with urinalysis recommended to exclude
superimposed UTI.
3. Aortic Atherosclerosis (GHVU2-R9G.G).

## 2022-10-02 IMAGING — DX DG CHEST 2V
2 series · 3 of 3 positions shown · non-contrast
Comparison: Chest CT 08/31/2021

CLINICAL DATA: Shortness of breath and weakness.

EXAM:
CHEST - 2 VIEW

[Series 2: chest lat · 0.14mm/px · 2 of 2 slices shown]
[im 1/2]
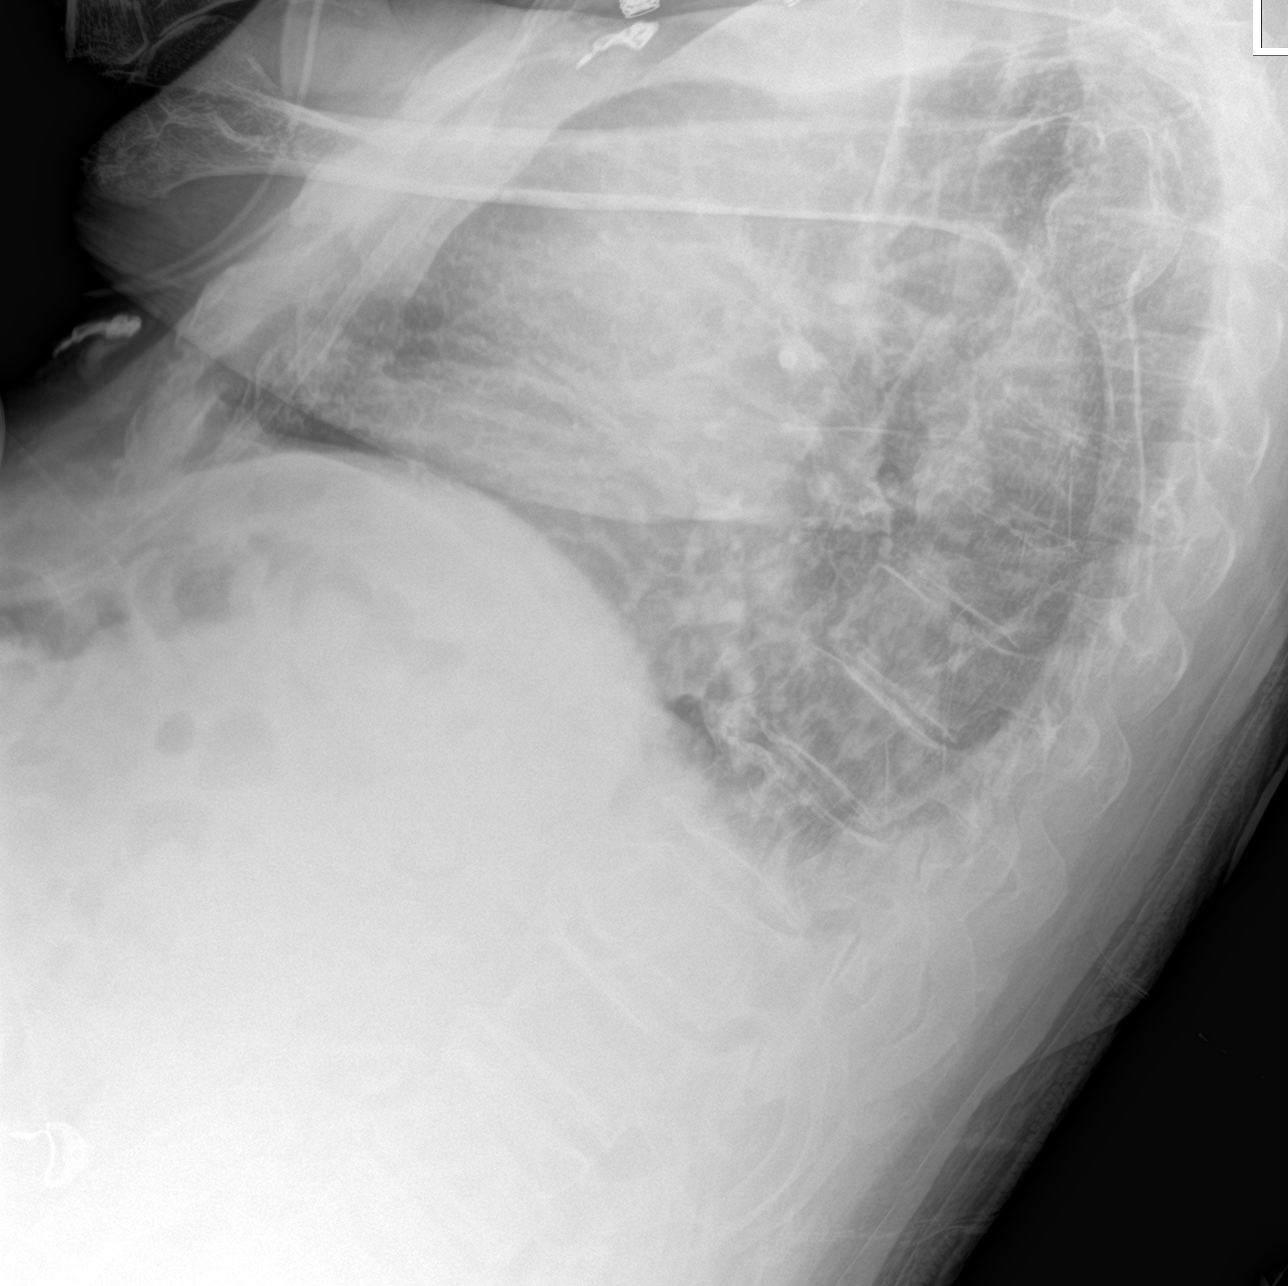
[im 2/2]
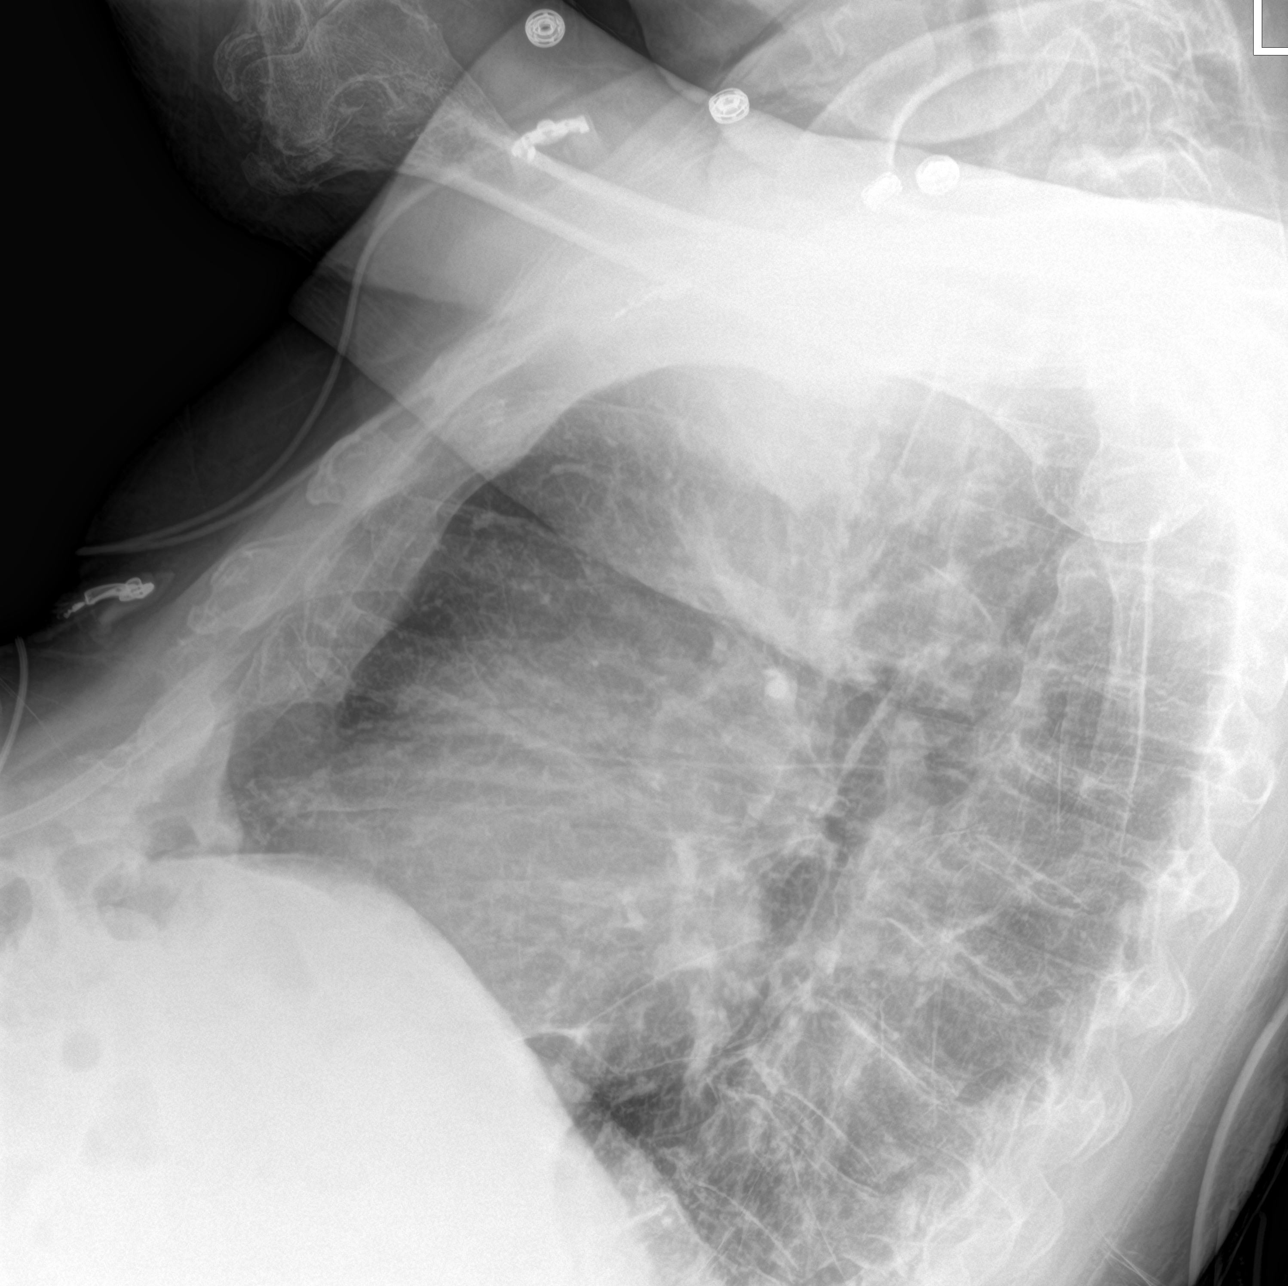

[chest ap]
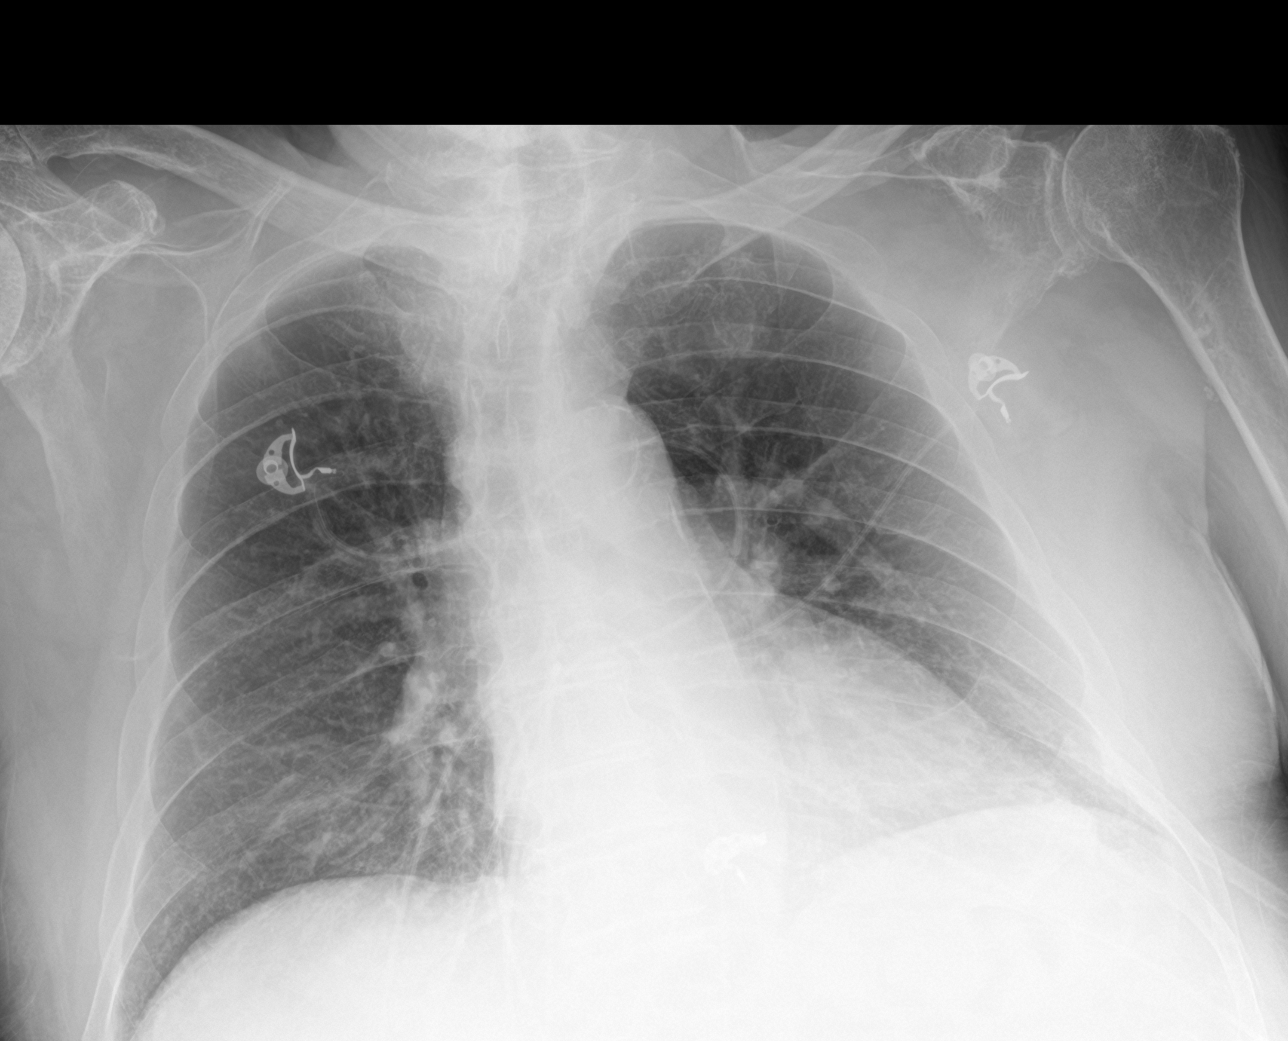

[3 of 3 positions shown; findings below may reference images not displayed]

FINDINGS: Lungs are clear without airspace disease, pulmonary edema or large
pleural effusions. Heart size is normal. Atherosclerotic
calcifications at the aortic arch. Degenerative changes in the
thoracic spine.
IMPRESSION: No active cardiopulmonary disease.
